# Patient Record
Sex: Female | Born: 1965
Health system: Southern US, Community
[De-identification: ages and names within clinical notes are randomized; demographics above are authoritative.]

## PROBLEM LIST (undated history)

## (undated) DIAGNOSIS — K635 Polyp of colon: Secondary | ICD-10-CM

## (undated) DIAGNOSIS — E7800 Pure hypercholesterolemia, unspecified: Secondary | ICD-10-CM

## (undated) DIAGNOSIS — F419 Anxiety disorder, unspecified: Secondary | ICD-10-CM

## (undated) HISTORY — DX: Polyp of colon: K63.5

## (undated) HISTORY — PX: LAPAROSCOPY: SHX197

---

## 2016-03-18 DIAGNOSIS — R0789 Other chest pain: Secondary | ICD-10-CM | POA: Diagnosis not present

## 2016-03-18 DIAGNOSIS — R079 Chest pain, unspecified: Secondary | ICD-10-CM | POA: Diagnosis not present

## 2016-09-09 DIAGNOSIS — E782 Mixed hyperlipidemia: Secondary | ICD-10-CM | POA: Diagnosis not present

## 2017-03-10 DIAGNOSIS — R198 Other specified symptoms and signs involving the digestive system and abdomen: Secondary | ICD-10-CM | POA: Diagnosis not present

## 2017-03-10 DIAGNOSIS — M25521 Pain in right elbow: Secondary | ICD-10-CM | POA: Diagnosis not present

## 2017-03-10 DIAGNOSIS — E782 Mixed hyperlipidemia: Secondary | ICD-10-CM | POA: Diagnosis not present

## 2017-04-28 ENCOUNTER — Encounter (HOSPITAL_COMMUNITY): Payer: Self-pay | Admitting: Nurse Practitioner

## 2017-04-28 ENCOUNTER — Emergency Department (HOSPITAL_COMMUNITY)
Admission: EM | Admit: 2017-04-28 | Discharge: 2017-04-28 | Disposition: A | Discharge status: Home / Self Care | Payer: BLUE CROSS/BLUE SHIELD | Attending: Emergency Medicine | Admitting: Emergency Medicine

## 2017-04-28 ENCOUNTER — Emergency Department (HOSPITAL_COMMUNITY): Payer: BLUE CROSS/BLUE SHIELD

## 2017-04-28 DIAGNOSIS — R0789 Other chest pain: Secondary | ICD-10-CM | POA: Insufficient documentation

## 2017-04-28 DIAGNOSIS — F17200 Nicotine dependence, unspecified, uncomplicated: Secondary | ICD-10-CM | POA: Insufficient documentation

## 2017-04-28 DIAGNOSIS — R079 Chest pain, unspecified: Secondary | ICD-10-CM | POA: Diagnosis not present

## 2017-04-28 HISTORY — DX: Anxiety disorder, unspecified: F41.9

## 2017-04-28 HISTORY — DX: Pure hypercholesterolemia, unspecified: E78.00

## 2017-04-28 LAB — CBC
HCT: 45.9 % (ref 36.0–46.0)
Hemoglobin: 15.1 g/dL — ABNORMAL HIGH (ref 12.0–15.0)
MCH: 30.2 pg (ref 26.0–34.0)
MCHC: 32.9 g/dL (ref 30.0–36.0)
MCV: 91.8 fL (ref 78.0–100.0)
PLATELETS: 256 10*3/uL (ref 150–400)
RBC: 5 MIL/uL (ref 3.87–5.11)
RDW: 14 % (ref 11.5–15.5)
WBC: 13.6 10*3/uL — AB (ref 4.0–10.5)

## 2017-04-28 LAB — I-STAT TROPONIN, ED
Troponin i, poc: 0 ng/mL (ref 0.00–0.08)
Troponin i, poc: 0 ng/mL (ref 0.00–0.08)

## 2017-04-28 LAB — D-DIMER, QUANTITATIVE (NOT AT ARMC)

## 2017-04-28 LAB — BASIC METABOLIC PANEL
Anion gap: 8 (ref 5–15)
BUN: 16 mg/dL (ref 6–20)
CALCIUM: 9.6 mg/dL (ref 8.9–10.3)
CO2: 25 mmol/L (ref 22–32)
CREATININE: 0.84 mg/dL (ref 0.44–1.00)
Chloride: 104 mmol/L (ref 101–111)
GFR calc Af Amer: >60 mL/min (ref >60)
GLUCOSE: 92 mg/dL (ref 65–99)
Potassium: 3.9 mmol/L (ref 3.5–5.1)
SODIUM: 137 mmol/L (ref 135–145)

## 2017-04-28 NOTE — ED Triage Notes (Signed)
Pt endorses left sided chest pain started one hour ago while she was at work. Endorses lightheadedness associated with it but denies shortness of breath, nausea, weakness, numbness or radiation. Patient states pain has been constant but will get acutely worse every once in awhile. Patient states called EMS who stated patient should come in to be further evaluated despite negative EKG.

## 2017-04-28 NOTE — Discharge Instructions (Signed)
Please follow up with family doctor if continue to have pain. Return if worsening symptoms.

## 2017-04-28 NOTE — ED Provider Notes (Signed)
[facility]EM[facility]ENC[facility] DEPT Provider Note   CSN: [id number] Arrival date & time: 04/28/17  1539     History   Chief Complaint Chief Complaint  Patient presents with  . Chest Pain    HPI Judith [name] is a 51 y.o. female.  HPI Judith [name] is a 51 y.o. female with history of anxiety, high cholesterol, presents to emergency department complaining of chest pain. Patient states she was at work sitting down when suddenly developed sharp pain in left side of the chest. She states pain now subsided. She reports similar pain in the past but not as severe. She had a full cardiac workup including a stress test, echo, blood tests, one year ago in [place]. She has now moved to [place] and does have a family doctor who she has seen for similar pain as well just 2 months ago. Advised her to take antacids and will schedule her for endoscopy if pain continues. She states today the pain was in slightly different location. She states it was higher. She denies anything making pain worse including movement or deep breathing. She states nothing makes it better. She does smoke. Denies hypertension or diabetes. She does have positive family history for heart disease and states her mother died from heart attack at 52. Her father passed away at 40 from heart disease. Patient denies any history of blood clots. She denies recent surgery or travel. No hormone therapy.  Past Medical History:  Diagnosis Date  . Anxiety   . Hypercholesteremia     There are no active problems to display for this patient.   History reviewed. No pertinent surgical history.  OB History    No data available       Home Medications    Prior to Admission medications   Not on File    Family History No family history on file.  Social History Social History  Substance Use Topics  . Smoking status: Current Every Day Smoker    Packs/day: 1.00  . Smokeless tobacco: Never Used  . Alcohol use Yes     Comment: occasionally       Allergies   Patient has no known allergies.   Review of Systems Review of Systems  Constitutional: Negative for chills and fever.  Respiratory: Positive for chest tightness. Negative for cough and shortness of breath.   Cardiovascular: Positive for chest pain. Negative for palpitations and leg swelling.  Gastrointestinal: Negative for abdominal pain, diarrhea, nausea and vomiting.  Genitourinary: Negative for dysuria, flank pain, pelvic pain, vaginal bleeding, vaginal discharge and vaginal pain.  Musculoskeletal: Negative for arthralgias, myalgias, neck pain and neck stiffness.  Skin: Negative for rash.  Neurological: Negative for dizziness, weakness and headaches.  All other systems reviewed and are negative.    Physical Exam Updated Vital Signs BP (!) 103/91 (BP Location: Left Arm)   Pulse 72   Temp 97.8 F (36.6 C) (Oral)   Resp (!) 8   Ht 5\' 9"  (1.753 m)   Wt 62.6 kg (138 lb)   LMP 04/14/2017   SpO2 99%   BMI 20.38 kg/m   Physical Exam  Constitutional: She appears well-developed and well-nourished. No distress.  HENT:  Head: Normocephalic.  Eyes: Conjunctivae are normal.  Neck: Neck supple.  Cardiovascular: Normal rate, regular rhythm and normal heart sounds.   Pulmonary/Chest: Effort normal and breath sounds normal. No respiratory distress. She has no wheezes. She has no rales. She exhibits no tenderness.  Abdominal: Soft. Bowel sounds are normal. She exhibits  no distension. There is no tenderness. There is no rebound.  Musculoskeletal: She exhibits no edema.  Neurological: She is alert.  Skin: Skin is warm and dry.  Psychiatric: She has a normal mood and affect. Her behavior is normal.  Nursing note and vitals reviewed.    ED Treatments / Results  Labs (all labs ordered are listed, but only abnormal results are displayed) Labs Reviewed  CBC - Abnormal; Notable for the following:       Result Value   WBC 13.6 (*)    Hemoglobin 15.1 (*)    All  other components within normal limits  BASIC METABOLIC PANEL  D-DIMER, QUANTITATIVE (NOT [facility])  I-STAT TROPOININ, ED  Rosezena SensorI-STAT TROPOININ, ED    EKG  EKG Interpretation None       Radiology Dg Chest 2 View  Result Date: 04/28/2017 CLINICAL DATA:  Left-sided chest pain starting this morning. EXAM: CHEST  2 VIEW COMPARISON:  No comparison studies available. FINDINGS: The lungs are clear without focal pneumonia, edema, pneumothorax or pleural effusion. The cardiopericardial silhouette is within normal limits for size. The visualized bony structures of the thorax are intact. Nodular density/densities projecting over the lungs are compatible with pads for telemetry leads. IMPRESSION: No active cardiopulmonary disease. Electronically Signed   By: Kennith CenterEric  Mansell M.D.   On: 04/28/2017 16:26    Procedures Procedures (including critical care time)  Medications Ordered in ED Medications - No data to display   Initial Impression / Assessment and Plan / ED Course  I have reviewed the triage vital signs and the nursing notes.  Pertinent labs & imaging results that were available during my care of the patient were reviewed by me and considered in my medical decision making (see chart for details).     Patient with left upper chest pain, sudden onset while at work a few hours ago. Blood work obtained in triage, negative troponin, white blood cell count slightly elevated at 13.6, normal electrolytes, normal chest x-ray. We will do delta troponin and check d-dimer.   Delta trop and d dimer negative. Pain atypical. Do not think acs, do not think dissection or PE. Will have her follow up with PCP. Return precautions discussed.   Vitals:   04/28/17 1914 04/28/17 1915 04/28/17 1945 04/28/17 2030  BP: (!) 103/91 130/80 114/76 108/76  Pulse: 72 71 66 61  Resp: (!) 8 14 19 17   Temp:      TempSrc:      SpO2: 99% 99% 98% 98%  Weight:      Height:        Final Clinical Impressions(s) / ED  Diagnoses   Final diagnoses:  Atypical chest pain    New Prescriptions There are no discharge medications for this patient.    Jaynie CrumbleKirichenko, Eleen Litz, PA-C 04/29/17 Karle [id number]    Zammit, Joseph, MD 04/29/17 [phone]

## 2017-04-29 DIAGNOSIS — R079 Chest pain, unspecified: Secondary | ICD-10-CM | POA: Diagnosis not present

## 2017-05-05 DIAGNOSIS — R0789 Other chest pain: Secondary | ICD-10-CM | POA: Diagnosis not present

## 2017-05-05 DIAGNOSIS — E782 Mixed hyperlipidemia: Secondary | ICD-10-CM | POA: Diagnosis not present

## 2017-05-05 DIAGNOSIS — K219 Gastro-esophageal reflux disease without esophagitis: Secondary | ICD-10-CM | POA: Diagnosis not present

## 2017-05-06 ENCOUNTER — Telehealth: Payer: Self-pay

## 2017-05-06 NOTE — Telephone Encounter (Signed)
SENT TO SCHEDULING 

## 2017-05-11 DIAGNOSIS — L301 Dyshidrosis [pompholyx]: Secondary | ICD-10-CM | POA: Diagnosis not present

## 2017-05-12 ENCOUNTER — Telehealth: Payer: Self-pay | Admitting: Internal Medicine

## 2017-05-12 NOTE — Telephone Encounter (Signed)
Received records from [facility] for appointment on 06/13/17 with Dr Rennis GoldenHilty.  Records put with Dr Blanchie DessertHilty's schedule for 06/13/17. lp

## 2017-05-30 DIAGNOSIS — R0789 Other chest pain: Secondary | ICD-10-CM

## 2017-05-30 DIAGNOSIS — K219 Gastro-esophageal reflux disease without esophagitis: Secondary | ICD-10-CM

## 2017-05-30 DIAGNOSIS — E782 Mixed hyperlipidemia: Secondary | ICD-10-CM | POA: Insufficient documentation

## 2017-06-13 ENCOUNTER — Encounter (INDEPENDENT_AMBULATORY_CARE_PROVIDER_SITE_OTHER): Payer: Self-pay

## 2017-06-13 ENCOUNTER — Ambulatory Visit (INDEPENDENT_AMBULATORY_CARE_PROVIDER_SITE_OTHER): Payer: BLUE CROSS/BLUE SHIELD | Admitting: Internal Medicine

## 2017-06-13 ENCOUNTER — Encounter: Payer: Self-pay | Admitting: Internal Medicine

## 2017-06-13 VITALS — BP 104/74 | HR 75 | Ht 68.0 in | Wt 140.0 lb

## 2017-06-13 DIAGNOSIS — E782 Mixed hyperlipidemia: Secondary | ICD-10-CM

## 2017-06-13 DIAGNOSIS — R0789 Other chest pain: Secondary | ICD-10-CM | POA: Diagnosis not present

## 2017-06-13 DIAGNOSIS — Z720 Tobacco use: Secondary | ICD-10-CM | POA: Diagnosis not present

## 2017-06-13 NOTE — Patient Instructions (Signed)
Medication Instructions:  Continue current medications  Labwork: None Ordered  Testing/Procedures: None Ordered  Follow-Up: Your physician recommends that you schedule a follow-up appointment in: As Needed   Any Other Special Instructions Will Be Listed Below (If Applicable).   If you need a refill on your cardiac medications before your next appointment, please call your pharmacy.   

## 2017-06-13 NOTE — Progress Notes (Signed)
OFFICE CONSULT NOTE  Chief Complaint:  Chest pain  Primary Care Physician: System, Pcp Not In  HPI:  Judith [name] is a 51 y.o. female who is being seen today for the evaluation of chest pain at the request of Le, Thao P, DO. She is a pleasant 51 year old female who is from [place]. Recently she was living in [place] and moved to [place]. She works for [employer]. Her husband works for them as well. Last year in the summer she had an episode of chest pain for which she underwent cardiac evaluation at [facility] in [facility]. She apparently had a stress test including a stress echocardiogram which was normal. There is a strong family history of heart disease in both parents. Her mother died at age 63 with heart attack and has a history of epilepsy and her father died at age 53 of a heart attack. She also has dyslipidemia which is well treated and her most recent LDL-C is 80. EKG which is personally reviewed today shows sinus rhythm with first-degree AV block at 75. She had a recent episode of chest pain which was sharp in the left side of chest which occurred at work. She ultimately presented the ER but was not noted to have any acute MI. She said the chest pain came on at rest, was not radiating and was somewhat worse after eating. Her PCP is concerned this may be reflux and put her on Dexilant.  PMHx:  Past Medical History:  Diagnosis Date  . Anxiety   . Hypercholesteremia     History reviewed. No pertinent surgical history.  FAMHx:  Family History  Problem Relation Age of Onset  . Heart attack Mother   . Heart attack Father   . Hyperlipidemia Brother   . Stroke Brother     SOCHx:   reports that she has been smoking.  She has been smoking about 1.00 pack per day. She has never used smokeless tobacco. She reports that she drinks alcohol. Her drug history is not on file.  ALLERGIES:  No Known Allergies  ROS: Pertinent items noted in HPI and  remainder of comprehensive ROS otherwise negative.  HOME MEDS: Current Outpatient Prescriptions on File Prior to Visit  Medication Sig Dispense Refill  . aspirin EC 81 MG tablet Take 81 mg by mouth daily.    Marland Kitchen atorvastatin (LIPITOR) 10 MG tablet Take 10 mg by mouth daily.  3  . Omega-3 Fatty Acids (FISH OIL OMEGA-3) 1000 MG CAPS Take 2 capsules by mouth daily.    . ranitidine (ZANTAC) 150 MG tablet Take 150 mg by mouth at bedtime.     No current facility-administered medications on file prior to visit.     LABS/IMAGING: No results found for this or any previous visit (from the past 48 hour(s)). No results found.  LIPID PANEL: No results found for: CHOL, TRIG, HDL, CHOLHDL, VLDL, LDLCALC, LDLDIRECT  WEIGHTS: Wt Readings from Last 3 Encounters:  06/13/17 140 lb (63.5 kg)  04/28/17 138 lb (62.6 kg)    VITALS: BP 104/74   Pulse 75   Ht 5\' 8"  (1.727 m)   Wt 140 lb (63.5 kg)   BMI 21.29 kg/m   EXAM: General appearance: alert and no distress Neck: no carotid bruit, no JVD and thyroid not enlarged, symmetric, no tenderness/mass/nodules Lungs: clear to auscultation bilaterally Heart: regular rate and rhythm Abdomen: soft, non-tender; bowel sounds normal; no masses,  no organomegaly Extremities: extremities normal, atraumatic, no  cyanosis or edema Pulses: 2+ and symmetric Skin: Skin color, texture, turgor normal. No rashes or lesions Neurologic: Grossly normal Psych: Pleasant  EKG: Sinus rhythm with first-degree AV block at 75, possible septal infarct - personally reviewed  ASSESSMENT: 1. Atypical chest pain 2. Dyslipidemia-manage 3. Tobacco abuse 4. Family history of premature coronary disease in both parents  PLAN: 1.   Judith [name] has family history of heart disease in both parents and has been having chest pain which sounds atypical for cardiac chest pain. It may very well be reflux and a GI workup is reasonable. She is a smoker and some of her symptoms could be  attributable to that. I've encouraged her to at least work on reducing her smoking. Cholesterol appears well managed. She has normal blood pressure off medication and is nondiabetic. I'll request records from her cardiology workup in [place] a year ago and review those, otherwise I would recommend follow-up as needed.  Thanks for the consultation.  Chrystie Nose, MD, [facility]  [place]  [facility]  Attending Cardiologist  Direct Dial: [phone]  Fax: [phone]  Website:  [email or web address] 06/13/2017, 4:57 PM

## 2017-08-31 DIAGNOSIS — Z124 Encounter for screening for malignant neoplasm of cervix: Secondary | ICD-10-CM | POA: Diagnosis not present

## 2017-08-31 DIAGNOSIS — Z0000 Encounter for general adult medical examination without abnormal findings: Secondary | ICD-10-CM | POA: Diagnosis not present

## 2017-08-31 DIAGNOSIS — E782 Mixed hyperlipidemia: Secondary | ICD-10-CM | POA: Diagnosis not present

## 2018-03-02 DIAGNOSIS — G4720 Circadian rhythm sleep disorder, unspecified type: Secondary | ICD-10-CM | POA: Diagnosis not present

## 2018-03-02 DIAGNOSIS — D729 Disorder of white blood cells, unspecified: Secondary | ICD-10-CM | POA: Diagnosis not present

## 2018-03-02 DIAGNOSIS — E782 Mixed hyperlipidemia: Secondary | ICD-10-CM | POA: Diagnosis not present

## 2018-03-02 DIAGNOSIS — R5383 Other fatigue: Secondary | ICD-10-CM | POA: Diagnosis not present

## 2018-06-13 DIAGNOSIS — M7712 Lateral epicondylitis, left elbow: Secondary | ICD-10-CM | POA: Diagnosis not present

## 2018-08-08 DIAGNOSIS — R1013 Epigastric pain: Secondary | ICD-10-CM | POA: Diagnosis not present

## 2018-08-08 DIAGNOSIS — N926 Irregular menstruation, unspecified: Secondary | ICD-10-CM | POA: Diagnosis not present

## 2018-08-08 DIAGNOSIS — D729 Disorder of white blood cells, unspecified: Secondary | ICD-10-CM | POA: Diagnosis not present

## 2018-08-08 DIAGNOSIS — R079 Chest pain, unspecified: Secondary | ICD-10-CM | POA: Diagnosis not present

## 2018-08-08 DIAGNOSIS — K219 Gastro-esophageal reflux disease without esophagitis: Secondary | ICD-10-CM | POA: Diagnosis not present

## 2018-08-08 DIAGNOSIS — E782 Mixed hyperlipidemia: Secondary | ICD-10-CM | POA: Diagnosis not present

## 2018-08-08 DIAGNOSIS — R0789 Other chest pain: Secondary | ICD-10-CM | POA: Diagnosis not present

## 2018-08-08 DIAGNOSIS — Z3202 Encounter for pregnancy test, result negative: Secondary | ICD-10-CM | POA: Diagnosis not present

## 2018-08-16 DIAGNOSIS — R1013 Epigastric pain: Secondary | ICD-10-CM | POA: Diagnosis not present

## 2018-08-16 DIAGNOSIS — D729 Disorder of white blood cells, unspecified: Secondary | ICD-10-CM | POA: Diagnosis not present

## 2018-08-28 DIAGNOSIS — Z1211 Encounter for screening for malignant neoplasm of colon: Secondary | ICD-10-CM | POA: Diagnosis not present

## 2018-08-28 DIAGNOSIS — K219 Gastro-esophageal reflux disease without esophagitis: Secondary | ICD-10-CM | POA: Diagnosis not present

## 2018-08-28 DIAGNOSIS — R079 Chest pain, unspecified: Secondary | ICD-10-CM | POA: Diagnosis not present

## 2018-09-25 DIAGNOSIS — D123 Benign neoplasm of transverse colon: Secondary | ICD-10-CM | POA: Diagnosis not present

## 2018-09-25 DIAGNOSIS — K219 Gastro-esophageal reflux disease without esophagitis: Secondary | ICD-10-CM | POA: Diagnosis not present

## 2018-09-25 DIAGNOSIS — D122 Benign neoplasm of ascending colon: Secondary | ICD-10-CM | POA: Diagnosis not present

## 2018-09-25 DIAGNOSIS — R079 Chest pain, unspecified: Secondary | ICD-10-CM | POA: Diagnosis not present

## 2018-09-25 DIAGNOSIS — Z1211 Encounter for screening for malignant neoplasm of colon: Secondary | ICD-10-CM | POA: Diagnosis not present

## 2018-09-25 DIAGNOSIS — D125 Benign neoplasm of sigmoid colon: Secondary | ICD-10-CM | POA: Diagnosis not present

## 2018-09-25 DIAGNOSIS — D120 Benign neoplasm of cecum: Secondary | ICD-10-CM | POA: Diagnosis not present

## 2018-09-25 LAB — HM COLONOSCOPY

## 2019-01-04 DIAGNOSIS — R350 Frequency of micturition: Secondary | ICD-10-CM | POA: Diagnosis not present

## 2019-01-04 DIAGNOSIS — N938 Other specified abnormal uterine and vaginal bleeding: Secondary | ICD-10-CM | POA: Diagnosis not present

## 2019-01-04 DIAGNOSIS — R102 Pelvic and perineal pain: Secondary | ICD-10-CM | POA: Diagnosis not present

## 2019-01-15 DIAGNOSIS — N898 Other specified noninflammatory disorders of vagina: Secondary | ICD-10-CM | POA: Diagnosis not present

## 2019-01-15 DIAGNOSIS — R102 Pelvic and perineal pain: Secondary | ICD-10-CM | POA: Diagnosis not present

## 2019-01-22 DIAGNOSIS — R102 Pelvic and perineal pain: Secondary | ICD-10-CM | POA: Diagnosis not present

## 2019-09-05 DIAGNOSIS — K219 Gastro-esophageal reflux disease without esophagitis: Secondary | ICD-10-CM | POA: Diagnosis not present

## 2019-09-05 DIAGNOSIS — Z86010 Personal history of colonic polyps: Secondary | ICD-10-CM | POA: Diagnosis not present

## 2019-10-04 ENCOUNTER — Encounter: Payer: Self-pay | Admitting: Osteopathic Medicine

## 2019-10-04 ENCOUNTER — Ambulatory Visit (INDEPENDENT_AMBULATORY_CARE_PROVIDER_SITE_OTHER): Payer: BC Managed Care – PPO | Admitting: Osteopathic Medicine

## 2019-10-04 VITALS — Ht 68.0 in | Wt 138.0 lb

## 2019-10-04 DIAGNOSIS — Z720 Tobacco use: Secondary | ICD-10-CM

## 2019-10-04 DIAGNOSIS — Z0000 Encounter for general adult medical examination without abnormal findings: Secondary | ICD-10-CM

## 2019-10-04 DIAGNOSIS — E782 Mixed hyperlipidemia: Secondary | ICD-10-CM

## 2019-10-04 DIAGNOSIS — K219 Gastro-esophageal reflux disease without esophagitis: Secondary | ICD-10-CM

## 2019-10-04 DIAGNOSIS — F17210 Nicotine dependence, cigarettes, uncomplicated: Secondary | ICD-10-CM

## 2019-10-04 DIAGNOSIS — S99922S Unspecified injury of left foot, sequela: Secondary | ICD-10-CM

## 2019-10-04 NOTE — Patient Instructions (Addendum)
General Preventive Care  Most recent routine screening lipids/other labs: ordered - please get this blood work done at lab on lower level of [employer]  Everyone should have blood pressure checked once per year.   Tobacco: don't! Please let me know if you need help quitting!  Alcohol: responsible moderation is ok for most adults - if you have concerns about your alcohol intake, please talk to me!   Exercise: as tolerated to reduce risk of cardiovascular disease and diabetes. Strength training will also prevent osteoporosis.   Mental health: if need for mental health care (medicines, counseling, other), or concerns about moods, please let me know!   Sexual health: if need for STD testing, or if concerns with libido/pain problems, please let me know!  Advanced Directive: Living Will and/or Healthcare Power of Attorney recommended for all adults, regardless of age or health.  Vaccines  Flu vaccine: recommended for almost everyone, every fall.   Shingles vaccine: Shingrix recommended after age 4.  Pneumonia vaccines: Prevnar and Pneumovax recommended after age 76, or sooner if certain medical conditions.  Tetanus booster: Tdap recommended every 10 years.   COVID vaccine: Currently there is not a firm plan for when this will be available to the public, or how/where it will be administered once it is available. Our clinic will probably be sending letters/MyChart messages to patients once we have more information/instructions.  Cancer screenings   Colon cancer screening: as directed by GI - will get records  Breast cancer screening: mammogram annually after age 3 - will get records  Cervical cancer screening: Pap every year for smokers!  Lung cancer screening: CT chest every year for those age 25 to 54 years old with ?30 pack year smoking history (average 1 pack per day for 30 years, 2 packs per day for 15 years, etc), who either currently smoke or have quit within the past  15 years. Infection screenings . HIV: recommended screening at least once age 43-65, more often as needed. . Gonorrhea/Chlamydia: screening as needed . Hepatitis C: recommended once for anyone born 90-1965 . TB: certain at-risk populations, or depending on work requirements and/or travel history Other . Bone Density Test: recommended for women at age 66, or usually younger for smokers

## 2019-10-04 NOTE — Progress Notes (Signed)
Virtual Visit via Video (App used: Doximity) Note  I connected with      Judith [name] on 10/04/19 at 2:25 PM  by a telemedicine application and verified that I am speaking with the correct person using two identifiers.  Patient is in her car I am in office   I discussed the limitations of evaluation and management by telemedicine and the availability of in person appointments. The patient expressed understanding and agreed to proceed.  History of Present Illness: Judith [name] is a 53 y.o. female who would like to discuss Annual Physical, new to establish care   No major concerns She is in her car on her way to [place], her brother is sick w/ heart failure Needs physical for insurance if possible    ROS negative    Observations/Objective: Ht 5\' 8"  (1.727 m)   Wt 138 lb (62.6 kg)   BMI 20.98 kg/m  BP Readings from Last 3 Encounters:  06/13/17 104/74  04/28/17 108/76   Exam: Normal Speech.  NAD  Lab and Radiology Results No results found for this or any previous visit (from the past 72 hour(s)). No results found.     Assessment and Plan: 53 y.o. female with The primary encounter diagnosis was Annual physical exam. Diagnoses of Mixed hyperlipidemia, Gastroesophageal reflux disease without esophagitis, and Tobacco abuse were also pertinent to this visit.   PDMP not reviewed this encounter. Orders Placed This Encounter  Procedures  . CBC  . COMPLETE METABOLIC PANEL WITH GFR  . LIPID SCREENING   No orders of the defined types were placed in this encounter.  Patient Instructions  General Preventive Care  Most recent routine screening lipids/other labs: ordered - please get this blood work done at lab on lower level of [address]  Everyone should have blood pressure checked once per year.   Tobacco: don't! Please let me know if you need help quitting!  Alcohol: responsible moderation is ok for most adults - if you have concerns about your alcohol  intake, please talk to me!   Exercise: as tolerated to reduce risk of cardiovascular disease and diabetes. Strength training will also prevent osteoporosis.   Mental health: if need for mental health care (medicines, counseling, other), or concerns about moods, please let me know!   Sexual health: if need for STD testing, or if concerns with libido/pain problems, please let me know!  Advanced Directive: Living Will and/or Healthcare Power of Attorney recommended for all adults, regardless of age or health.  Vaccines  Flu vaccine: recommended for almost everyone, every fall.   Shingles vaccine: Shingrix recommended after age 95.  Pneumonia vaccines: Prevnar and Pneumovax recommended after age 17, or sooner if certain medical conditions.  Tetanus booster: Tdap recommended every 10 years.   COVID vaccine: Currently there is not a firm plan for when this will be available to the public, or how/where it will be administered once it is available. Our clinic will probably be sending letters/MyChart messages to patients once we have more information/instructions.  Cancer screenings   Colon cancer screening: as directed by GI - will get records  Breast cancer screening: mammogram annually after age 29 - will get records  Cervical cancer screening: Pap every year for smokers!  Lung cancer screening: CT chest every year for those age 58 to 54 years old with ?30 pack year smoking history (average 1 pack per day for 30 years, 2 packs per day for 15 years, etc), who either currently smoke  or have quit within the past 15 years. Infection screenings . HIV: recommended screening at least once age 28-65, more often as needed. . Gonorrhea/Chlamydia: screening as needed . Hepatitis C: recommended once for anyone born [birth year]-1965 . TB: certain at-risk populations, or depending on work requirements and/or travel history Other . Bone Density Test: recommended for women at age 17, or usually younger for  smokers   Instructions sent via [place]. If MyChart not available, pt was given option for info via personal e-mail w/ no guarantee of protected health info over unsecured e-mail communication, and MyChart sign-up instructions were sent to patient.   Follow Up Instructions: Return for RECHECK PENDING LAB RESULTS - if ok, will see you in a year! .    I discussed the assessment and treatment plan with the patient. The patient was provided an opportunity to ask questions and all were answered. The patient agreed with the plan and demonstrated an understanding of the instructions.   The patient was advised to call back or seek an in-person evaluation if any new concerns, if symptoms worsen or if the condition fails to improve as anticipated.  30 minutes of non-face-to-face time was provided during this encounter.      . . . . . . . . . . . . . Marland Kitchen                   Historical information moved to improve visibility of documentation.  Past Medical History:  Diagnosis Date  . Anxiety   . Colon polyps   . Hypercholesteremia    History reviewed. No pertinent surgical history. Social History   Tobacco Use  . Smoking status: Current Every Day Smoker    Packs/day: 1.50  . Smokeless tobacco: Never Used  Substance Use Topics  . Alcohol use: Yes    Comment: three times a year   family history includes Alcoholism in her father and mother; Heart attack in her father and mother; Hyperlipidemia in her brother; Stroke in her brother.  Medications: Current Outpatient Medications  Medication Sig Dispense Refill  . omeprazole (PRILOSEC) 20 MG capsule Take by mouth.    Marland Kitchen aspirin EC 81 MG tablet Take 81 mg by mouth daily.    Marland Kitchen atorvastatin (LIPITOR) 10 MG tablet Take 10 mg by mouth daily.  3  . Omega-3 Fatty Acids (FISH OIL OMEGA-3) 1000 MG CAPS Take 2 capsules by mouth daily.    . ranitidine (ZANTAC) 150 MG tablet Take 150 mg by mouth at bedtime.     No current  facility-administered medications for this visit.   No Known Allergies

## 2019-10-04 NOTE — Addendum Note (Signed)
Addended by: Maryla Morrow on: 10/04/2019 03:03 PM   Modules accepted: Level of Service

## 2019-10-04 NOTE — Addendum Note (Signed)
Addended by: Maryla Morrow on: 10/04/2019 03:14 PM   Modules accepted: Orders

## 2019-10-25 ENCOUNTER — Encounter: Payer: Self-pay | Admitting: Osteopathic Medicine

## 2019-10-25 LAB — CBC
HCT: 48.3 % — ABNORMAL HIGH (ref 35.0–45.0)
Hemoglobin: 16.3 g/dL — ABNORMAL HIGH (ref 11.7–15.5)
MCH: 30.2 pg (ref 27.0–33.0)
MCHC: 33.7 g/dL (ref 32.0–36.0)
MCV: 89.6 fL (ref 80.0–100.0)
MPV: 9.5 fL (ref 7.5–12.5)
Platelets: 293 10*3/uL (ref 140–400)
RBC: 5.39 10*6/uL — ABNORMAL HIGH (ref 3.80–5.10)
RDW: 12.6 % (ref 11.0–15.0)
WBC: 13.1 10*3/uL — ABNORMAL HIGH (ref 3.8–10.8)

## 2019-10-25 LAB — COMPLETE METABOLIC PANEL WITH GFR
AG Ratio: 1.6 (calc) (ref 1.0–2.5)
ALT: 13 U/L (ref 6–29)
AST: 14 U/L (ref 10–35)
Albumin: 4.7 g/dL (ref 3.6–5.1)
Alkaline phosphatase (APISO): 99 U/L (ref 37–153)
BUN: 14 mg/dL (ref 7–25)
CO2: 27 mmol/L (ref 20–32)
Calcium: 10.1 mg/dL (ref 8.6–10.4)
Chloride: 100 mmol/L (ref 98–110)
Creat: 0.68 mg/dL (ref 0.50–1.05)
GFR, Est African American: 115 mL/min/{1.73_m2} (ref >=60)
GFR, Est Non African American: 99 mL/min/{1.73_m2} (ref >=60)
Globulin: 2.9 g/dL (calc) (ref 1.9–3.7)
Glucose, Bld: 87 mg/dL (ref 65–139)
Potassium: 4.9 mmol/L (ref 3.5–5.3)
Sodium: 137 mmol/L (ref 135–146)
Total Bilirubin: 0.4 mg/dL (ref 0.2–1.2)
Total Protein: 7.6 g/dL (ref 6.1–8.1)

## 2019-10-25 LAB — LIPID PANEL
Cholesterol: 225 mg/dL — ABNORMAL HIGH (ref <200)
HDL: 48 mg/dL — ABNORMAL LOW (ref >=50)
LDL Cholesterol (Calc): 152 mg/dL (calc) — ABNORMAL HIGH
Non-HDL Cholesterol (Calc): 177 mg/dL (calc) — ABNORMAL HIGH (ref <130)
Total CHOL/HDL Ratio: 4.7 (calc) (ref <5.0)
Triglycerides: 124 mg/dL (ref <150)

## 2019-10-30 ENCOUNTER — Telehealth: Payer: Self-pay

## 2019-10-30 ENCOUNTER — Telehealth: Payer: Self-pay | Admitting: Physician Assistant

## 2019-10-30 DIAGNOSIS — D582 Other hemoglobinopathies: Secondary | ICD-10-CM

## 2019-10-30 DIAGNOSIS — D72829 Elevated white blood cell count, unspecified: Secondary | ICD-10-CM

## 2019-10-30 NOTE — Telephone Encounter (Signed)
Pt states that she spoke to her [employer] [facility] company and they said that if they could get a letter of medical necessity from their doctor that it will cover the cost of home gym equipment. Pt is wanting to know if they can get this LMN for her cholesterol and her husband, Trey Paula Fausto, for weight loss.

## 2019-10-30 NOTE — Progress Notes (Signed)
This is PA Carrine Kroboth covering for Dr. Lyn Hollingshead  Your white blood cell count is abnormally high, but unchanged compared to 2018. This could be a condition called benign leukocytosis. I would recommend repeating this test in the next week or two with something called a peripheral smear, where a doctor looks at your blood cells under a microscope to rule out presence of abnormal cells.  Your hemoglobin level has also increased a bit compared to 2018. Sometimes this can be seen with smoking/tobacco products. Do you smoke?  Total and LDL cholesterol are elevated. Recommend low-carb, heart healthy diet like DASH or Mediterranean, try to walk at least 20 minutes daily You can read more about managing your cholesterol here: [email or web address]

## 2019-11-01 ENCOUNTER — Encounter: Payer: Self-pay | Admitting: Physician Assistant

## 2019-11-01 NOTE — Telephone Encounter (Signed)
Letter available on patient's MyChart

## 2019-11-01 NOTE — Telephone Encounter (Signed)
Pt aware letter has been written and is available for viewing and downloading via MyChart.

## 2019-11-01 NOTE — Telephone Encounter (Signed)
CBC W/DIFF and SMEAR for Leukocytosis and Polycythemia

## 2019-11-06 LAB — CBC (INCLUDES DIFF/PLT) WITH PATHOLOGIST REVIEW
Absolute Monocytes: 924 cells/uL (ref 200–950)
Basophils Absolute: 72 cells/uL (ref 0–200)
Basophils Relative: 0.6 %
Eosinophils Absolute: 216 cells/uL (ref 15–500)
Eosinophils Relative: 1.8 %
HCT: 49 % — ABNORMAL HIGH (ref 35.0–45.0)
Hemoglobin: 16.5 g/dL — ABNORMAL HIGH (ref 11.7–15.5)
Lymphs Abs: 4212 cells/uL — ABNORMAL HIGH (ref 850–3900)
MCH: 30.2 pg (ref 27.0–33.0)
MCHC: 33.7 g/dL (ref 32.0–36.0)
MCV: 89.7 fL (ref 80.0–100.0)
MPV: 9.8 fL (ref 7.5–12.5)
Monocytes Relative: 7.7 %
Neutro Abs: 6576 cells/uL (ref 1500–7800)
Neutrophils Relative %: 54.8 %
Platelets: 286 10*3/uL (ref 140–400)
RBC: 5.46 10*6/uL — ABNORMAL HIGH (ref 3.80–5.10)
RDW: 12.6 % (ref 11.0–15.0)
Total Lymphocyte: 35.1 %
WBC: 12 10*3/uL — ABNORMAL HIGH (ref 3.8–10.8)

## 2019-11-09 ENCOUNTER — Encounter: Payer: Self-pay | Admitting: Osteopathic Medicine

## 2019-11-13 DIAGNOSIS — R05 Cough: Secondary | ICD-10-CM | POA: Diagnosis not present

## 2019-11-13 DIAGNOSIS — Z86010 Personal history of colonic polyps: Secondary | ICD-10-CM | POA: Diagnosis not present

## 2019-11-13 DIAGNOSIS — K219 Gastro-esophageal reflux disease without esophagitis: Secondary | ICD-10-CM | POA: Diagnosis not present

## 2019-11-14 ENCOUNTER — Ambulatory Visit: Payer: BC Managed Care – PPO | Admitting: Podiatry

## 2019-11-15 ENCOUNTER — Encounter: Payer: Self-pay | Admitting: Podiatry

## 2019-11-15 ENCOUNTER — Other Ambulatory Visit: Payer: Self-pay

## 2019-11-15 ENCOUNTER — Ambulatory Visit (INDEPENDENT_AMBULATORY_CARE_PROVIDER_SITE_OTHER): Payer: BC Managed Care – PPO | Admitting: Podiatry

## 2019-11-15 ENCOUNTER — Ambulatory Visit: Payer: BC Managed Care – PPO

## 2019-11-15 VITALS — Temp 97.4°F

## 2019-11-15 DIAGNOSIS — S90212A Contusion of left great toe with damage to nail, initial encounter: Secondary | ICD-10-CM

## 2019-11-15 DIAGNOSIS — L600 Ingrowing nail: Secondary | ICD-10-CM | POA: Diagnosis not present

## 2019-11-15 MED ORDER — NEOMYCIN-POLYMYXIN-HC 3.5-10000-1 OT SOLN: OTIC | 0 refills | Status: DC

## 2019-11-15 NOTE — Patient Instructions (Signed)

## 2019-11-15 NOTE — Progress Notes (Signed)
   Subjective:    Patient ID: Judith [name], female    DOB: 1965/12/31, 54 y.o.   MRN: [id number]  HPI    Review of Systems  All other systems reviewed and are negative.      Objective:   Physical Exam        Assessment & Plan:

## 2019-11-16 ENCOUNTER — Ambulatory Visit (INDEPENDENT_AMBULATORY_CARE_PROVIDER_SITE_OTHER): Payer: BC Managed Care – PPO

## 2019-11-16 ENCOUNTER — Encounter: Payer: Self-pay | Admitting: Sports Medicine

## 2019-11-16 ENCOUNTER — Ambulatory Visit (INDEPENDENT_AMBULATORY_CARE_PROVIDER_SITE_OTHER): Payer: BC Managed Care – PPO | Admitting: Sports Medicine

## 2019-11-16 DIAGNOSIS — G8929 Other chronic pain: Secondary | ICD-10-CM | POA: Diagnosis not present

## 2019-11-16 DIAGNOSIS — M545 Low back pain: Secondary | ICD-10-CM | POA: Diagnosis not present

## 2019-11-16 MED ORDER — MELOXICAM 15 MG PO TABS: ORAL_TABLET | ORAL | 3 refills | Status: DC

## 2019-11-16 NOTE — Progress Notes (Signed)
    Procedures performed today:    None.  Independent interpretation of tests performed by another provider:   None.  Impression and Recommendations:    Chronic low back pain Judith [name] has had years of axial low back pain, discogenic. Nothing overtly radicular. No red flags. We discussed the anthropology of low back pain. Adding x-rays, meloxicam, home rehab exercises, return to see me in 6 weeks, MR for interventional planning if no better.    ___________________________________________ [name]. [name], M.D., ABFM., CAQSM. Primary Care and Sports Medicine [facility]  Adjunct Instructor of Family Medicine  [employer]

## 2019-11-16 NOTE — Assessment & Plan Note (Signed)
Judith [name] has had years of axial low back pain, discogenic. Nothing overtly radicular. No red flags. We discussed the anthropology of low back pain. Adding x-rays, meloxicam, home rehab exercises, return to see me in 6 weeks, MR for interventional planning if no better.

## 2019-11-20 NOTE — Progress Notes (Signed)
Subjective:   Patient ID: Judith [name], female   DOB: 54 y.o.   MRN: [id number]   HPI Patient presents stating he is got a chronically painful big toenail left that she is tried to trim tried to soak and it seems to get worse and is been going on several years with history of injury.  Patient does not smoke likes to be active   Review of Systems  All other systems reviewed and are negative.       Objective:  Physical Exam Vitals and nursing note reviewed.  Constitutional:      Appearance: She is well-developed.  Pulmonary:     Effort: Pulmonary effort is normal.  Musculoskeletal:        General: Normal range of motion.  Skin:    General: Skin is warm.  Neurological:     Mental Status: She is alert.     Neurovascular status intact muscle strength found to be adequate range of motion within normal limits.  Patient is found to have thick yellow brittle hallux nail bed that is painful when pressed left and make shoe gear difficult with patient having tried wider shoe soaks in the past and cutting techniques      Assessment:  Chronic damaged hallux nail left with pathology and pain     Plan:  H&P reviewed condition recommended nail removal and explained procedure to patient.  Explained risk and she signed consent form after review and today I infiltrated the left hallux 60 mg like Marcaine mixture sterile prep applied to the toe and using sterile instrumentation remove the hallux nail exposed matrix and applied phenol for applications 30 seconds followed by alcohol lavage sterile dressing.  Give instructions on soaks and reappoint for [place] to recheck and encouraged to call with questions and leave dressing on 24 hours but take it off earlier if throbbing with drops being written by patient at this time

## 2019-12-17 ENCOUNTER — Telehealth: Payer: Self-pay | Admitting: *Deleted

## 2019-12-17 NOTE — Telephone Encounter (Signed)
Pt states she had an ingrown toenail over 1 month ago and it is red and still oozing blood.

## 2019-12-17 NOTE — Telephone Encounter (Signed)
I spoke with pt and she states the area is still oozing the bloody drainage. I told pt that on occasion there may be a little regrowth and she would need to be seen to have it removed, to continue the soaks and drops until seen in office. Pt states understanding and I transferred to schedulers.

## 2019-12-19 ENCOUNTER — Ambulatory Visit (INDEPENDENT_AMBULATORY_CARE_PROVIDER_SITE_OTHER): Payer: BC Managed Care – PPO | Admitting: Podiatry

## 2019-12-19 ENCOUNTER — Other Ambulatory Visit: Payer: Self-pay

## 2019-12-19 DIAGNOSIS — L600 Ingrowing nail: Secondary | ICD-10-CM

## 2019-12-19 DIAGNOSIS — L03032 Cellulitis of left toe: Secondary | ICD-10-CM

## 2019-12-19 MED ORDER — DOXYCYCLINE HYCLATE 100 MG PO TABS: 100.0000 mg | ORAL_TABLET | Freq: Two times a day (BID) | ORAL | 0 refills | Status: DC

## 2019-12-19 NOTE — Patient Instructions (Signed)

## 2019-12-21 ENCOUNTER — Encounter: Payer: Self-pay | Admitting: Podiatry

## 2019-12-21 NOTE — Progress Notes (Signed)
  Subjective:  Patient ID: Judith [name], female    DOB: 1966-10-12,  MRN: [id number]  Chief Complaint  Patient presents with  . Nail Problem    pt is here for possible infected toenail of the left great toenail, pt states that it has been constantly oozing and draining. pt states that the pain is often a 4 out of 42    54 y.o. female presents with the above complaint.  Patient presents with toenail erythema around the left hallux after she had undergone a total nail avulsion by Judith [name].  Patient states that has been going for about 5 days.  There is some redness around the toenail.  Patient states been constantly draining.  Patient has been applying Band-Aid and been doing gentamicin drops.  Pain scale is 4 out of 10.  She states that she has not taken any antibiotics for this.  She denies any other acute complaints.   Review of Systems: Negative except as noted in the HPI. Denies N/V/F/Ch.  Past Medical History:  Diagnosis Date  . Anxiety   . Colon polyps   . Hypercholesteremia     Current Outpatient Medications:  .  meloxicam (MOBIC) 15 MG tablet, One tab PO qAM with a meal for 2 weeks, then daily prn pain., Disp: 30 tablet, Rfl: 3 .  neomycin-polymyxin-hydrocortisone (CORTISPORIN) OTIC solution, Apply 1-2 drops to toe after soaking twice a day, Disp: 10 mL, Rfl: 0 .  omeprazole (PRILOSEC) 20 MG capsule, Take by mouth., Disp: , Rfl:  .  doxycycline (VIBRA-TABS) 100 MG tablet, Take 1 tablet (100 mg total) by mouth 2 (two) times daily., Disp: 20 tablet, Rfl: 0  Social History   Tobacco Use  Smoking Status Current Every Day Smoker  . Packs/day: 1.50  Smokeless Tobacco Never Used    No Known Allergies Objective:  There were no vitals filed for this visit. There is no height or weight on file to calculate BMI. Constitutional Well developed. Well nourished.  Vascular Dorsalis pedis pulses palpable bilaterally. Posterior tibial pulses palpable bilaterally. Capillary refill normal  to all digits.  No cyanosis or clubbing noted. Pedal hair growth normal.  Neurologic Normal speech. Oriented to person, place, and time. Epicritic sensation to light touch grossly present bilaterally.  Dermatologic  pain on palpation to the left hallux.  There is some maceration around the hallux skin edges as well as the nailbed itself. No other open wounds. No skin lesions.  There are some mild erythema surrounding the hallux left.  Orthopedic: Normal joint ROM without pain or crepitus bilaterally. No visible deformities. No bony tenderness.   Radiographs: None Assessment:   1. Paronychia of toe of left foot due to ingrown toenail    Plan:  Patient was evaluated and treated and all questions answered.  Left great toe pain status post left hallux total nail avulsion -Clinically it appears the patient has mild erythema after undergoing a total nail avulsion, I believe that this is likely due to soft tissue infection.  I believe patient will benefit from doxycycline for skin and soft tissue prophylaxis.  I have asked the patient to stop doing soaking as well as keeping it dry as this is leading to maceration of the proximal nail fold as well as the nailbed itself.  She states understanding will do so. -I will see her back again in 2 weeks to evaluate if the infection has improved. No follow-ups on file.

## 2019-12-28 ENCOUNTER — Other Ambulatory Visit: Payer: Self-pay

## 2019-12-28 ENCOUNTER — Ambulatory Visit (INDEPENDENT_AMBULATORY_CARE_PROVIDER_SITE_OTHER): Payer: BC Managed Care – PPO | Admitting: Sports Medicine

## 2019-12-28 ENCOUNTER — Encounter: Payer: Self-pay | Admitting: Sports Medicine

## 2019-12-28 DIAGNOSIS — G8929 Other chronic pain: Secondary | ICD-10-CM

## 2019-12-28 DIAGNOSIS — M545 Low back pain, unspecified: Secondary | ICD-10-CM

## 2019-12-28 DIAGNOSIS — M7712 Lateral epicondylitis, left elbow: Secondary | ICD-10-CM | POA: Diagnosis not present

## 2019-12-28 NOTE — Assessment & Plan Note (Signed)
For few months this pleasant 54 year old female has occasional on and off pains at her left lateral elbow. This looks like tennis elbow though she is not having any symptoms today and the elbow exam is unremarkable. Adding tennis elbow rehab exercises, return to see me as needed.

## 2019-12-28 NOTE — Progress Notes (Signed)
    Procedures performed today:    None.  Independent interpretation of notes and tests performed by another provider:   I personally reviewed her lumbar spine x-rays which show multilevel lumbar DDD.  Impression and Recommendations:    Left lateral epicondylitis For few months this pleasant 54 year old female has occasional on and off pains at her left lateral elbow. This looks like tennis elbow though she is not having any symptoms today and the elbow exam is unremarkable. Adding tennis elbow rehab exercises, return to see me as needed.  Chronic low back pain Judith [name] returns, she is had years of axial low back pain, discogenic, symptoms improved considerably with meloxicam and home rehab exercises, continue the rehab, she feels as though she can live with her mild residual discomfort right now.     ___________________________________________ Judith [name]. Judith [name], M.D., ABFM., CAQSM. Primary Care and Sports Medicine [facility]  Adjunct Instructor of Family Medicine  [employer]

## 2019-12-28 NOTE — Assessment & Plan Note (Signed)
Kjirsten returns, she is had years of axial low back pain, discogenic, symptoms improved considerably with meloxicam and home rehab exercises, continue the rehab, she feels as though she can live with her mild residual discomfort right now.

## 2020-01-09 ENCOUNTER — Ambulatory Visit (INDEPENDENT_AMBULATORY_CARE_PROVIDER_SITE_OTHER): Payer: BC Managed Care – PPO | Admitting: Podiatry

## 2020-01-09 ENCOUNTER — Other Ambulatory Visit: Payer: Self-pay

## 2020-01-09 DIAGNOSIS — L03032 Cellulitis of left toe: Secondary | ICD-10-CM | POA: Diagnosis not present

## 2020-01-09 DIAGNOSIS — L600 Ingrowing nail: Secondary | ICD-10-CM | POA: Diagnosis not present

## 2020-01-10 ENCOUNTER — Encounter: Payer: Self-pay | Admitting: Podiatry

## 2020-01-10 NOTE — Progress Notes (Signed)
  Subjective:  Patient ID: Judith [name], female    DOB: 03/11/1966,  MRN: [id number]  Chief Complaint  Patient presents with  . Nail Problem    pt is here for a f/u of the left great toenail, pt is concerned that she is not healing correctly,pt also states that the left big toenail has been oozing as well as painful.    54 y.o. female presents with the above complaint.  Patient presents with toenail erythema around the left hallux after she had undergone a total nail avulsion by Dr. Charlsie Merles.  Patient states that it is not improving as quickly as she would like.  There is some still oozing present.  Patient has not been soaking in Epsom salt or using drops.  Pain scale 3 out of 10.  She has been soaking the foot.  She has been applying the triple antibiotic and a Band-Aid.  She denies any other acute complaints.   Review of Systems: Negative except as noted in the HPI. Denies N/V/F/Ch.  Past Medical History:  Diagnosis Date  . Anxiety   . Colon polyps   . Hypercholesteremia     Current Outpatient Medications:  .  doxycycline (VIBRA-TABS) 100 MG tablet, Take 1 tablet (100 mg total) by mouth 2 (two) times daily., Disp: 20 tablet, Rfl: 0 .  meloxicam (MOBIC) 15 MG tablet, One tab PO qAM with a meal for 2 weeks, then daily prn pain., Disp: 30 tablet, Rfl: 3 .  neomycin-polymyxin-hydrocortisone (CORTISPORIN) OTIC solution, Apply 1-2 drops to toe after soaking twice a day, Disp: 10 mL, Rfl: 0 .  omeprazole (PRILOSEC) 20 MG capsule, Take by mouth., Disp: , Rfl:   Social History   Tobacco Use  Smoking Status Current Every Day Smoker  . Packs/day: 1.50  Smokeless Tobacco Never Used    No Known Allergies Objective:  There were no vitals filed for this visit. There is no height or weight on file to calculate BMI. Constitutional Well developed. Well nourished.  Vascular Dorsalis pedis pulses palpable bilaterally. Posterior tibial pulses palpable bilaterally. Capillary refill normal to all  digits.  No cyanosis or clubbing noted. Pedal hair growth normal.  Neurologic Normal speech. Oriented to person, place, and time. Epicritic sensation to light touch grossly present bilaterally.  Dermatologic  pain on palpation to the left hallux.  There is some maceration around the hallux skin edges as well as the nailbed itself. No other open wounds. No skin lesions.  There are some mild erythema surrounding the hallux left.  Orthopedic: Normal joint ROM without pain or crepitus bilaterally. No visible deformities. No bony tenderness.   Radiographs: None Assessment:   1. Paronychia of toe of left foot due to ingrown toenail   2. Ingrown nail    Plan:  Patient was evaluated and treated and all questions answered.  Left great toe pain with maceration status post left hallux total nail avulsion -Clinically the redness/erythema from last visit was improved with doxycycline.  However the healing of the wound after total nail avulsion is slowly progressing.  Given that there is significant amount of maceration present around the nailbed itself I believe patient will benefit benefit from Betadine wet-to-dry dressing changes.  I have asked the patient to not get it wet.  Patient states understanding and will do so. -I will see her back again in 2 weeks to evaluate if the infection has improved. No follow-ups on file.

## 2020-01-30 ENCOUNTER — Other Ambulatory Visit: Payer: Self-pay

## 2020-01-30 ENCOUNTER — Ambulatory Visit (INDEPENDENT_AMBULATORY_CARE_PROVIDER_SITE_OTHER): Payer: BC Managed Care – PPO | Admitting: Podiatry

## 2020-01-30 DIAGNOSIS — L600 Ingrowing nail: Secondary | ICD-10-CM

## 2020-01-30 DIAGNOSIS — L03032 Cellulitis of left toe: Secondary | ICD-10-CM | POA: Diagnosis not present

## 2020-01-31 ENCOUNTER — Encounter: Payer: Self-pay | Admitting: Podiatry

## 2020-01-31 NOTE — Progress Notes (Signed)
  Subjective:  Patient ID: Judith [name], female    DOB: 1966-02-02,  MRN: [id number]  Chief Complaint  Patient presents with  . Nail Problem    pt is here for a 3 week f/u of the left great toenail, pt is concerned as far as the darkness, pt states she is not in pain, but states it feels weird when she is getting it wet    54 y.o. female presents with the above complaint..  Patient is erythema as well as maceration has completely resolved.  Her pain has considerably gone down.  She has been applying Betadine dressings daily to the foot which has helped considerably.  I have discussed with the patient today that she can stop doing dressing changes and applying Betadine.  She can start getting her foot wet.   Review of Systems: Negative except as noted in the HPI. Denies N/V/F/Ch.  Past Medical History:  Diagnosis Date  . Anxiety   . Colon polyps   . Hypercholesteremia     Current Outpatient Medications:  .  doxycycline (VIBRA-TABS) 100 MG tablet, Take 1 tablet (100 mg total) by mouth 2 (two) times daily., Disp: 20 tablet, Rfl: 0 .  meloxicam (MOBIC) 15 MG tablet, One tab PO qAM with a meal for 2 weeks, then daily prn pain., Disp: 30 tablet, Rfl: 3 .  neomycin-polymyxin-hydrocortisone (CORTISPORIN) OTIC solution, Apply 1-2 drops to toe after soaking twice a day, Disp: 10 mL, Rfl: 0 .  omeprazole (PRILOSEC) 20 MG capsule, Take by mouth., Disp: , Rfl:   Social History   Tobacco Use  Smoking Status Current Every Day Smoker  . Packs/day: 1.50  Smokeless Tobacco Never Used    No Known Allergies Objective:  There were no vitals filed for this visit. There is no height or weight on file to calculate BMI. Constitutional Well developed. Well nourished.  Vascular Dorsalis pedis pulses palpable bilaterally. Posterior tibial pulses palpable bilaterally. Capillary refill normal to all digits.  No cyanosis or clubbing noted. Pedal hair growth normal.  Neurologic Normal speech. Oriented to  person, place, and time. Epicritic sensation to light touch grossly present bilaterally.  Dermatologic  no pain on palpation to the left hallux.  There is no maceration around the hallux skin edges as well as the nailbed itself. No other open wounds. No skin lesions.  There no erythema surrounding the hallux left.  Orthopedic: Normal joint ROM without pain or crepitus bilaterally. No visible deformities. No bony tenderness.   Radiographs: None Assessment:   1. Paronychia of toe of left foot due to ingrown toenail    Plan:  Patient was evaluated and treated and all questions answered.  Left great toe pain with maceration status post left hallux total nail avulsion -The maceration has completely improved with Betadine wet-to-dry dressing changes.  Patient pain has also considerably decreased. -I discussed with the patient that if any foot and ankle issues arises come back and see me or Dr. Charlsie [name]. No follow-ups on file.

## 2020-05-13 ENCOUNTER — Telehealth: Payer: Self-pay | Admitting: Sports Medicine

## 2020-05-13 DIAGNOSIS — Z720 Tobacco use: Secondary | ICD-10-CM

## 2020-05-13 MED ORDER — CHANTIX STARTING MONTH PAK 0.5 MG X 11 & 1 MG X 42 PO TABS: ORAL_TABLET | ORAL | 0 refills | Status: DC

## 2020-05-13 NOTE — Telephone Encounter (Signed)
Judith [name]'s husband has metastatic cancer, she is interested in [facility] and wanted me to call this in during the appointment for her husband, I am happy to do this.  Further follow-up really needs to be with PCP.

## 2020-05-14 ENCOUNTER — Other Ambulatory Visit: Payer: Self-pay | Admitting: Sports Medicine

## 2020-05-14 MED ORDER — CHANTIX STARTING MONTH PAK 0.5 MG X 11 & 1 MG X 42 PO TABS: ORAL_TABLET | ORAL | 0 refills | Status: DC

## 2020-05-16 ENCOUNTER — Other Ambulatory Visit: Payer: Self-pay | Admitting: Sports Medicine

## 2020-05-16 MED ORDER — NICOTINE 7 MG/24HR TD PT24: 7.0000 mg | MEDICATED_PATCH | Freq: Every day | TRANSDERMAL | 0 refills | Status: DC

## 2020-06-19 ENCOUNTER — Encounter: Payer: Self-pay | Admitting: Osteopathic Medicine

## 2020-08-08 ENCOUNTER — Encounter: Payer: Self-pay | Admitting: Osteopathic Medicine

## 2020-08-08 ENCOUNTER — Other Ambulatory Visit: Payer: Self-pay | Admitting: Osteopathic Medicine

## 2020-08-08 DIAGNOSIS — Z1231 Encounter for screening mammogram for malignant neoplasm of breast: Secondary | ICD-10-CM

## 2020-08-11 ENCOUNTER — Telehealth: Payer: Self-pay

## 2020-08-11 NOTE — Telephone Encounter (Signed)
Requesting referral for DEXA scan from PCP.  Message has been forwarded.

## 2020-08-11 NOTE — Telephone Encounter (Signed)
I discuss bone density testing and order this screening at annual physicals

## 2020-08-13 NOTE — Telephone Encounter (Signed)
Attempted to contact Judith [name] to advise her of Dr. Mardelle Matte response concerning the DEXA scan request. LVM for return call for information.

## 2020-08-14 ENCOUNTER — Ambulatory Visit (INDEPENDENT_AMBULATORY_CARE_PROVIDER_SITE_OTHER): Payer: BC Managed Care – PPO

## 2020-08-14 ENCOUNTER — Other Ambulatory Visit: Payer: Self-pay

## 2020-08-14 DIAGNOSIS — Z1231 Encounter for screening mammogram for malignant neoplasm of breast: Secondary | ICD-10-CM | POA: Diagnosis not present

## 2020-08-18 ENCOUNTER — Other Ambulatory Visit: Payer: Self-pay | Admitting: Osteopathic Medicine

## 2020-08-18 DIAGNOSIS — R928 Other abnormal and inconclusive findings on diagnostic imaging of breast: Secondary | ICD-10-CM

## 2020-09-02 ENCOUNTER — Ambulatory Visit
Admission: RE | Admit: 2020-09-02 | Discharge: 2020-09-02 | Disposition: A | Discharge status: Home / Self Care | Payer: BC Managed Care – PPO | Source: Ambulatory Visit | Attending: Osteopathic Medicine | Admitting: Osteopathic Medicine

## 2020-09-02 ENCOUNTER — Other Ambulatory Visit: Payer: Self-pay

## 2020-09-02 DIAGNOSIS — R928 Other abnormal and inconclusive findings on diagnostic imaging of breast: Secondary | ICD-10-CM

## 2020-09-02 DIAGNOSIS — N6489 Other specified disorders of breast: Secondary | ICD-10-CM | POA: Diagnosis not present

## 2020-09-02 DIAGNOSIS — R922 Inconclusive mammogram: Secondary | ICD-10-CM | POA: Diagnosis not present

## 2020-10-02 ENCOUNTER — Other Ambulatory Visit: Payer: Self-pay | Admitting: Osteopathic Medicine

## 2020-10-02 DIAGNOSIS — R928 Other abnormal and inconclusive findings on diagnostic imaging of breast: Secondary | ICD-10-CM

## 2020-10-06 ENCOUNTER — Encounter: Payer: BC Managed Care – PPO | Admitting: Osteopathic Medicine

## 2020-10-06 ENCOUNTER — Other Ambulatory Visit (HOSPITAL_COMMUNITY)
Admission: RE | Admit: 2020-10-06 | Discharge: 2020-10-06 | Disposition: A | Discharge status: Home / Self Care | Payer: BC Managed Care – PPO | Source: Ambulatory Visit | Attending: Osteopathic Medicine | Admitting: Osteopathic Medicine

## 2020-10-06 ENCOUNTER — Encounter: Payer: Self-pay | Admitting: Medical-Surgical

## 2020-10-06 ENCOUNTER — Other Ambulatory Visit: Payer: Self-pay

## 2020-10-06 ENCOUNTER — Ambulatory Visit (INDEPENDENT_AMBULATORY_CARE_PROVIDER_SITE_OTHER): Payer: BC Managed Care – PPO | Admitting: Medical-Surgical

## 2020-10-06 ENCOUNTER — Ambulatory Visit (HOSPITAL_BASED_OUTPATIENT_CLINIC_OR_DEPARTMENT_OTHER)
Admission: RE | Admit: 2020-10-06 | Discharge: 2020-10-06 | Disposition: A | Discharge status: Home / Self Care | Payer: BC Managed Care – PPO | Source: Ambulatory Visit | Attending: Medical-Surgical | Admitting: Medical-Surgical

## 2020-10-06 VITALS — BP 102/69 | HR 80 | Temp 98.6°F | Ht 69.0 in | Wt 141.4 lb

## 2020-10-06 DIAGNOSIS — Z13820 Encounter for screening for osteoporosis: Secondary | ICD-10-CM | POA: Insufficient documentation

## 2020-10-06 DIAGNOSIS — Z114 Encounter for screening for human immunodeficiency virus [HIV]: Secondary | ICD-10-CM

## 2020-10-06 DIAGNOSIS — Z780 Asymptomatic menopausal state: Secondary | ICD-10-CM | POA: Diagnosis not present

## 2020-10-06 DIAGNOSIS — Z1151 Encounter for screening for human papillomavirus (HPV): Secondary | ICD-10-CM | POA: Diagnosis not present

## 2020-10-06 DIAGNOSIS — Z1159 Encounter for screening for other viral diseases: Secondary | ICD-10-CM

## 2020-10-06 DIAGNOSIS — Z124 Encounter for screening for malignant neoplasm of cervix: Secondary | ICD-10-CM | POA: Insufficient documentation

## 2020-10-06 DIAGNOSIS — Z0000 Encounter for general adult medical examination without abnormal findings: Secondary | ICD-10-CM

## 2020-10-06 DIAGNOSIS — M8589 Other specified disorders of bone density and structure, multiple sites: Secondary | ICD-10-CM | POA: Diagnosis not present

## 2020-10-06 DIAGNOSIS — E2839 Other primary ovarian failure: Secondary | ICD-10-CM | POA: Diagnosis not present

## 2020-10-06 NOTE — Progress Notes (Signed)
HPI: Judith [name] is a 54 y.o. female who  has a past medical history of Anxiety, Colon polyps, and Hypercholesteremia.  she presents to [facility] today, 10/06/20,  for chief complaint of: Annual physical exam  Dentist: last week, getting concerns fixed Eye exam: April 2021, glasses Exercise: none  Diet: well-balanced, trying to eat healthier Pap smear: doing today, 1.5 yrs ago, abnormals in the past Mammogram: done last month Colon cancer screening: UTD COVID vaccine: done  Concerns: None  Past medical, surgical, social and family history reviewed:  Patient Active Problem List   Diagnosis Date Noted  . Left lateral epicondylitis 12/28/2019  . Chronic low back pain 11/16/2019  . Leukocytosis 10/30/2019  . Elevated hemoglobin ([place]) 10/30/2019  . Tobacco abuse 06/13/2017  . Atypical chest pain 05/30/2017  . Gastroesophageal reflux disease without esophagitis 05/30/2017  . Mixed hyperlipidemia 05/30/2017    History reviewed. No pertinent surgical history.  Social History   Tobacco Use  . Smoking status: Current Every Day Smoker    Packs/day: 0.50  . Smokeless tobacco: Never Used  . Tobacco comment: also uses nicoteine patch  Substance Use Topics  . Alcohol use: Yes    Comment: rarely    Family History  Problem Relation Age of Onset  . Heart attack Mother   . Alcoholism Mother   . Heart attack Father   . Alcoholism Father   . Hyperlipidemia Brother   . Stroke Brother   . Osteopenia Sister      Current medication list and allergy/intolerance information reviewed:    Current Outpatient Medications  Medication Sig Dispense Refill  . nicotine (NICODERM CQ - DOSED IN MG/24 HR) 7 mg/24hr patch Place 1 patch (7 mg total) onto the skin daily. 28 patch 0   No current facility-administered medications for this visit.    No Known Allergies    Review of Systems:  Constitutional:  No  fever, no chills, No recent illness, No  unintentional weight changes. No significant fatigue.   HEENT: No  headache, no vision change, no hearing change, No sore throat, No  sinus pressure  Cardiac: No  chest pain, No  pressure, No palpitations, No  Orthopnea  Respiratory:  No  shortness of breath. No  Cough  Gastrointestinal: No  abdominal pain, No  nausea, No  vomiting,  No  blood in stool, No  diarrhea, No  constipation   Musculoskeletal: No new myalgia/arthralgia  Skin: No  Rash, No other wounds/concerning lesions  Genitourinary: No  incontinence, No  abnormal genital bleeding, No abnormal genital discharge  Hem/Onc: No  easy bruising/bleeding, No  abnormal lymph node  Endocrine: No cold intolerance,  No heat intolerance. No polyuria/polydipsia/polyphagia   Neurologic: No  weakness, No  dizziness, No  slurred speech/focal weakness/facial droop  Psychiatric: No  concerns with depression, No  concerns with anxiety, No sleep problems, No mood problems  Exam:  BP 102/69   Pulse 80   Temp 98.6 F (37 C)   Ht _0  (1.753 m)   Wt 141 lb 6.4 oz (64.1 kg)   LMP 04/14/2017   SpO2 97%   BMI 20.88 kg/m   Constitutional: VS see above. General Appearance: alert, well-developed, well-nourished, NAD  Eyes: Normal lids and conjunctive, non-icteric sclera  Ears, Nose, Mouth, Throat: MMM, Normal external inspection ears/nares/mouth/lips/gums. TM normal bilaterally.  Neck: No masses, trachea midline. No thyroid enlargement. No tenderness/mass appreciated. No lymphadenopathy  Respiratory: Normal respiratory effort. no wheeze, no rhonchi, no  rales  Cardiovascular: S1/S2 normal, no murmur, no rub/gallop auscultated. RRR. No lower extremity edema. No carotid bruit or JVD. No abdominal aortic bruit.  Gastrointestinal: Nontender, no masses. No hepatomegaly, no splenomegaly. No hernia appreciated. Bowel sounds normal. Rectal exam deferred.   Musculoskeletal: Gait normal. No clubbing/cyanosis of digits.   Neurological: Normal  balance/coordination. No tremor. No cranial nerve deficit on limited exam. Motor and sensation intact and symmetric. Cerebellar reflexes intact.   Skin: warm, dry, intact. No rash/ulcer. No concerning nevi or subq nodules on limited exam.    Psychiatric: Normal judgment/insight. Normal mood and affect. Oriented x3.   No results found for this or any previous visit (from the past 72 hour(s)).  No results found.  ASSESSMENT/PLAN:   1. Annual physical exam Checking CBC, CMP, and Lipid panel.  2. Screening for cervical cancer Pap smear with HPV co-testing today.  - Cytology - PAP  3. Screening for HIV/Hepatitis C Discussed screening recommendations. Patient agreeable so adding to blood work today.  4. Screening for osteoporosis DEXA scan ordered today.  Orders Placed This Encounter  Procedures  . DG Bone Density  . Hepatitis C antibody  . HIV Antibody (routine testing w rflx)  . CBC  . COMPLETE METABOLIC PANEL WITH GFR  . Lipid panel   No orders of the defined types were placed in this encounter.  Patient Instructions  Preventive Care 42-23 Years Old, Female Preventive care refers to visits with your health care provider and lifestyle choices that can promote health and wellness. This includes:  A yearly physical exam. This may also be called an annual well check.  Regular dental visits and eye exams.  Immunizations.  Screening for certain conditions.  Healthy lifestyle choices, such as eating a healthy diet, getting regular exercise, not using drugs or products that contain nicotine and tobacco, and limiting alcohol use. What can I expect for my preventive care visit? Physical exam Your health care provider will check your:  Height and weight. This may be used to calculate body mass index (BMI), which tells if you are at a healthy weight.  Heart rate and blood pressure.  Skin for abnormal spots. Counseling Your health care provider may ask you questions about  your:  Alcohol, tobacco, and drug use.  Emotional well-being.  Home and relationship well-being.  Sexual activity.  Eating habits.  Work and work Statistician.  Method of birth control.  Menstrual cycle.  Pregnancy history. What immunizations do I need?  Influenza (flu) vaccine  This is recommended every year. Tetanus, diphtheria, and pertussis (Tdap) vaccine  You may need a Td booster every 10 years. Varicella (chickenpox) vaccine  You may need this if you have not been vaccinated. Zoster (shingles) vaccine  You may need this after age 57. Measles, mumps, and rubella (MMR) vaccine  You may need at least one dose of MMR if you were born in 1957 or later. You may also need a second dose. Pneumococcal conjugate (PCV13) vaccine  You may need this if you have certain conditions and were not previously vaccinated. Pneumococcal polysaccharide (PPSV23) vaccine  You may need one or two doses if you smoke cigarettes or if you have certain conditions. Meningococcal conjugate (MenACWY) vaccine  You may need this if you have certain conditions. Hepatitis A vaccine  You may need this if you have certain conditions or if you travel or work in places where you may be exposed to hepatitis A. Hepatitis B vaccine  You may need this if you  have certain conditions or if you travel or work in places where you may be exposed to hepatitis B. Haemophilus influenzae type b (Hib) vaccine  You may need this if you have certain conditions. Human papillomavirus (HPV) vaccine  If recommended by your health care provider, you may need three doses over 6 months. You may receive vaccines as individual doses or as more than one vaccine together in one shot (combination vaccines). Talk with your health care provider about the risks and benefits of combination vaccines. What tests do I need? Blood tests  Lipid and cholesterol levels. These may be checked every 5 years, or more frequently if  you are over 27 years old.  Hepatitis C test.  Hepatitis B test. Screening  Lung cancer screening. You may have this screening every year starting at age 38 if you have a 30-pack-year history of smoking and currently smoke or have quit within the past 15 years.  Colorectal cancer screening. All adults should have this screening starting at age 55 and continuing until age 64. Your health care provider may recommend screening at age 62 if you are at increased risk. You will have tests every 1-10 years, depending on your results and the type of screening test.  Diabetes screening. This is done by checking your blood sugar (glucose) after you have not eaten for a while (fasting). You may have this done every 1-3 years.  Mammogram. This may be done every 1-2 years. Talk with your health care provider about when you should start having regular mammograms. This may depend on whether you have a family history of breast cancer.  BRCA-related cancer screening. This may be done if you have a family history of breast, ovarian, tubal, or peritoneal cancers.  Pelvic exam and Pap test. This may be done every 3 years starting at age 45. Starting at age 55, this may be done every 5 years if you have a Pap test in combination with an HPV test. Other tests  Sexually transmitted disease (STD) testing.  Bone density scan. This is done to screen for osteoporosis. You may have this scan if you are at high risk for osteoporosis. Follow these instructions at home: Eating and drinking  Eat a diet that includes fresh fruits and vegetables, whole grains, lean protein, and low-fat dairy.  Take vitamin and mineral supplements as recommended by your health care provider.  Do not drink alcohol if: ? Your health care provider tells you not to drink. ? You are pregnant, may be pregnant, or are planning to become pregnant.  If you drink alcohol: ? Limit how much you have to 0-1 drink a day. ? Be aware of how much  alcohol is in your drink. In the [place] one drink equals one 12 oz bottle of beer (355 mL), one 5 oz glass of wine (148 mL), or one 1 oz glass of hard liquor (44 mL). Lifestyle  Take daily care of your teeth and gums.  Stay active. Exercise for at least 30 minutes on 5 or more days each week.  Do not use any products that contain nicotine or tobacco, such as cigarettes, e-cigarettes, and chewing tobacco. If you need help quitting, ask your health care provider.  If you are sexually active, practice safe sex. Use a condom or other form of birth control (contraception) in order to prevent pregnancy and STIs (sexually transmitted infections).  If told by your health care provider, take low-dose aspirin daily starting at age 72. What's next?  Visit your health care provider once a year for a well check visit.  Ask your health care provider how often you should have your eyes and teeth checked.  Stay up to date on all vaccines. This information is not intended to replace advice given to you by your health care provider. Make sure you discuss any questions you have with your health care provider. Document Revised: 06/22/2018 Document Reviewed: 06/22/2018 Elsevier Patient Education  [place].  Follow-up plan: Return in about 1 year (around 10/06/2021) for annual physical exam.  Clearnce Sorrel, DNP, APRN, FNP-BC [facility]

## 2020-10-06 NOTE — Patient Instructions (Signed)

## 2020-10-07 ENCOUNTER — Telehealth: Payer: Self-pay

## 2020-10-07 LAB — HEPATITIS C ANTIBODY
Hepatitis C Ab: NONREACTIVE
SIGNAL TO CUT-OFF: 0.02 (ref <1.00)

## 2020-10-07 LAB — COMPLETE METABOLIC PANEL WITH GFR
AG Ratio: 1.5 (calc) (ref 1.0–2.5)
ALT: 17 U/L (ref 6–29)
AST: 17 U/L (ref 10–35)
Albumin: 4.4 g/dL (ref 3.6–5.1)
Alkaline phosphatase (APISO): 125 U/L (ref 37–153)
BUN: 17 mg/dL (ref 7–25)
CO2: 30 mmol/L (ref 20–32)
Calcium: 10.2 mg/dL (ref 8.6–10.4)
Chloride: 104 mmol/L (ref 98–110)
Creat: 0.79 mg/dL (ref 0.50–1.05)
GFR, Est African American: 98 mL/min/{1.73_m2} (ref >=60)
GFR, Est Non African American: 84 mL/min/{1.73_m2} (ref >=60)
Globulin: 2.9 g/dL (calc) (ref 1.9–3.7)
Glucose, Bld: 80 mg/dL (ref 65–99)
Potassium: 5.1 mmol/L (ref 3.5–5.3)
Sodium: 141 mmol/L (ref 135–146)
Total Bilirubin: 0.4 mg/dL (ref 0.2–1.2)
Total Protein: 7.3 g/dL (ref 6.1–8.1)

## 2020-10-07 LAB — CBC
HCT: 43.8 % (ref 35.0–45.0)
Hemoglobin: 15 g/dL (ref 11.7–15.5)
MCH: 30.8 pg (ref 27.0–33.0)
MCHC: 34.2 g/dL (ref 32.0–36.0)
MCV: 89.9 fL (ref 80.0–100.0)
MPV: 9.8 fL (ref 7.5–12.5)
Platelets: 307 10*3/uL (ref 140–400)
RBC: 4.87 10*6/uL (ref 3.80–5.10)
RDW: 12.7 % (ref 11.0–15.0)
WBC: 11 10*3/uL — ABNORMAL HIGH (ref 3.8–10.8)

## 2020-10-07 LAB — LIPID PANEL
Cholesterol: 248 mg/dL — ABNORMAL HIGH (ref <200)
HDL: 49 mg/dL — ABNORMAL LOW (ref >=50)
LDL Cholesterol (Calc): 172 mg/dL (calc) — ABNORMAL HIGH
Non-HDL Cholesterol (Calc): 199 mg/dL (calc) — ABNORMAL HIGH (ref <130)
Total CHOL/HDL Ratio: 5.1 (calc) — ABNORMAL HIGH (ref <5.0)
Triglycerides: 135 mg/dL (ref <150)

## 2020-10-07 LAB — CYTOLOGY - PAP
Comment: NEGATIVE
Diagnosis: NEGATIVE
High risk HPV: NEGATIVE

## 2020-10-07 LAB — HIV ANTIBODY (ROUTINE TESTING W REFLEX): HIV 1&2 Ab, 4th Generation: NONREACTIVE

## 2020-10-07 NOTE — Telephone Encounter (Signed)
This is fine with me   

## 2020-10-07 NOTE — Telephone Encounter (Signed)
Pt is requesting to change PCP's within the practice:   Current PCP: Dr. Lyn Hollingshead Requested PCP: Christen Butter, FNP  Pt is wanting to change PCP's stating that she has never seen Dr. Lyn Hollingshead in person and now that she has seen Joy in office for her CPE, she wants Joy to be her new PCP.    Please advise regarding approval/denial.

## 2020-10-08 ENCOUNTER — Encounter: Payer: Self-pay | Admitting: Medical-Surgical

## 2020-10-08 NOTE — Telephone Encounter (Signed)
Pt aware that both providers approved her changing PCP's. This has been updated in her chart.   Pt states that she wants Joy to address the cholesterol treatment.

## 2020-10-08 NOTE — Telephone Encounter (Signed)
Switch is fine w/ me

## 2020-10-08 NOTE — Telephone Encounter (Signed)
Direct message sent to patient via MyChart.

## 2020-11-12 ENCOUNTER — Encounter: Payer: Self-pay | Admitting: Medical-Surgical

## 2020-11-12 ENCOUNTER — Ambulatory Visit (INDEPENDENT_AMBULATORY_CARE_PROVIDER_SITE_OTHER): Payer: BC Managed Care – PPO

## 2020-11-12 ENCOUNTER — Ambulatory Visit (INDEPENDENT_AMBULATORY_CARE_PROVIDER_SITE_OTHER): Payer: BC Managed Care – PPO | Admitting: Medical-Surgical

## 2020-11-12 ENCOUNTER — Other Ambulatory Visit: Payer: Self-pay

## 2020-11-12 VITALS — BP 113/74 | HR 91 | Temp 97.6°F | Ht 69.0 in | Wt 146.6 lb

## 2020-11-12 DIAGNOSIS — M79604 Pain in right leg: Secondary | ICD-10-CM

## 2020-11-12 DIAGNOSIS — E782 Mixed hyperlipidemia: Secondary | ICD-10-CM | POA: Diagnosis not present

## 2020-11-12 DIAGNOSIS — M50322 Other cervical disc degeneration at C5-C6 level: Secondary | ICD-10-CM

## 2020-11-12 DIAGNOSIS — M79605 Pain in left leg: Secondary | ICD-10-CM

## 2020-11-12 DIAGNOSIS — G8929 Other chronic pain: Secondary | ICD-10-CM

## 2020-11-12 DIAGNOSIS — R222 Localized swelling, mass and lump, trunk: Secondary | ICD-10-CM | POA: Diagnosis not present

## 2020-11-12 DIAGNOSIS — R29898 Other symptoms and signs involving the musculoskeletal system: Secondary | ICD-10-CM | POA: Diagnosis not present

## 2020-11-12 DIAGNOSIS — M25561 Pain in right knee: Secondary | ICD-10-CM

## 2020-11-12 DIAGNOSIS — F5101 Primary insomnia: Secondary | ICD-10-CM | POA: Insufficient documentation

## 2020-11-12 DIAGNOSIS — M47812 Spondylosis without myelopathy or radiculopathy, cervical region: Secondary | ICD-10-CM | POA: Diagnosis not present

## 2020-11-12 DIAGNOSIS — R531 Weakness: Secondary | ICD-10-CM | POA: Diagnosis not present

## 2020-11-12 MED ORDER — GABAPENTIN 100 MG PO CAPS: 100.0000 mg | ORAL_CAPSULE | Freq: Three times a day (TID) | ORAL | 3 refills | Status: DC

## 2020-11-12 MED ORDER — ATORVASTATIN CALCIUM 20 MG PO TABS: 20.0000 mg | ORAL_TABLET | Freq: Every day | ORAL | 3 refills | Status: DC

## 2020-11-12 MED ORDER — MELOXICAM 15 MG PO TABS: 15.0000 mg | ORAL_TABLET | Freq: Every day | ORAL | 0 refills | Status: DC

## 2020-11-12 NOTE — Progress Notes (Signed)
Subjective:    CC: Insomnia, leg/hand pain  HPI: Pleasant 55 year old female presenting today for evaluation of the following:  Insomnia-notes that she has difficulty falling asleep as well as staying asleep.  Wakes up frequently throughout the night when she is able to finally fall asleep.  This has been going on for quite a while and she has not found anything available over-the-counter that was helpful.  Leg pain-Notes that she has a deep ache in her bilateral legs that affects her lower legs more than anything else but does extend into her upper legs.  Notes that the sensation is not pins-and-needles or a crawling sensation.  She elevates her legs which helps some and she has tried ibuprofen but not regularly taking it.  Denies saddle paresthesias, new onset incontinence, and leg weakness.  No difficulty with ambulation.  Notes that her right knee has been bothering her for a long time but it has recently gotten much worse.  Arm/hand pain-she does have some medial epicondyle pain over the left left arm that has been going on for a while.  She was previously evaluated and told this was tennis elbow but notes that she does not use her left arm most of the time for regular activities.  She is strongly right arm dominant.  Has noted recently that her right hand is weaker than usual and that she is now having to use kitchen aids to be able to open jars.  I reviewed the past medical history, family history, social history, surgical history, and allergies today and no changes were needed.  Please see the problem list section below in epic for further details.  Past Medical History: Past Medical History:  Diagnosis Date  . Anxiety   . Colon polyps   . Hypercholesteremia    Past Surgical History: History reviewed. No pertinent surgical history. Social History: Social History   Socioeconomic History  . Marital status: Married    Spouse name: Kacie Huxtable  . Number of children: 3  . Years  of education: Not on file  . Highest education level: Not on file  Occupational History    Employer: [employer]   Tobacco Use  . Smoking status: Current Every Day Smoker    Packs/day: 0.50  . Smokeless tobacco: Never Used  . Tobacco comment: also uses nicoteine patch  Vaping Use  . Vaping Use: Never used  Substance and Sexual Activity  . Alcohol use: Yes    Comment: rarely  . Drug use: Not Currently    Types: Marijuana  . Sexual activity: Yes    Partners: Male    Birth control/protection: Post-menopausal  Other Topics Concern  . Not on file  Social History Narrative  . Not on file   Social Determinants of Health   Financial Resource Strain: Not on file  Food Insecurity: Not on file  Transportation Needs: Not on file  Physical Activity: Not on file  Stress: Not on file  Social Connections: Not on file   Family History: Family History  Problem Relation Age of Onset  . Heart attack Mother   . Alcoholism Mother   . Heart attack Father   . Alcoholism Father   . Hyperlipidemia Brother   . Stroke Brother   . Osteopenia Sister    Allergies: No Known Allergies Medications: See med rec.  Review of Systems: See HPI for pertinent positives and negatives.   Objective:    General: Well Developed, well nourished, and in no acute distress.  Neuro: Alert  and oriented x3.  Hand grips equal bilaterally. HEENT: Normocephalic, atraumatic.  Skin: Warm and dry. Cardiac: Regular rate and rhythm, no murmurs rubs or gallops, no lower extremity edema.  Respiratory: Clear to auscultation bilaterally. Not using accessory muscles, speaking in full sentences. MSK: Strength bilateral legs 5 of 5.  No crepitus, popping, catching, or locking to the right knee.  Tenderness to palpation along the medial distal aspect of the knee joint.  Left arm tenderness along the medial epicondyle.  Full range of motion to bilateral upper and lower extremities.  Impression and Recommendations:    1. Mixed  hyperlipidemia Since her atorvastatin never made it to the pharmacy and to her, we are resending atorvastatin 20 mg daily.  Plan for recheck of lipid panel and CMP in 6 weeks after being on the medication. - atorvastatin (LIPITOR) 20 MG tablet; Take 1 tablet (20 mg total) by mouth daily.  Dispense: 90 tablet; Refill: 3 - Lipid panel; Future - COMPLETE METABOLIC PANEL WITH GFR; Future  2. Leg pain, bilateral Checking vitamin D today.  Unclear etiology of leg symptoms.  Consider restless leg syndrome versus lumbar radiculopathy versus neuropathy versus vitamin D deficiency.  Discussed treatment options.  We will go ahead and start with gabapentin 100 mg nightly for 1 week then increase to 200 mg nightly.  If she does not receive any relief using gabapentin, also sending in meloxicam to try for 2 weeks daily then daily as needed. - VITAMIN D 25 Hydroxy (Vit-D Deficiency, Fractures)  3. Right hand weakness Right hand weakness is subjective as her exam does not show any grip deficiencies of either side.  We will go ahead and get an x-ray of her cervical spine as I suspect her arm symptoms are stemming from neck concerns.  See above for prescribed medications. - DG Cervical Spine Complete; Future  4. Chronic pain of right knee X-rays of the right knee today.  See above for medications for pain management. - DG Knee Complete 4 Views Right; Future  5.  Insomnia Starting gabapentin 100 mg nightly as above with increase in nightly dose as tolerated.  Given the sedative nature of this medication suspect this will likely help with both sleep onset and sleep maintenance.  Return in about 4 weeks (around 12/10/2020) for leg pain/insomnia follow up. ___________________________________________ Thayer Ohm, DNP, APRN, FNP-BC Primary [facility] and Sports Medicine [facility]

## 2020-11-13 LAB — VITAMIN D 25 HYDROXY (VIT D DEFICIENCY, FRACTURES): Vit D, 25-Hydroxy: 20 ng/mL — ABNORMAL LOW (ref 30–100)

## 2020-12-10 ENCOUNTER — Encounter: Payer: Self-pay | Admitting: Medical-Surgical

## 2020-12-10 ENCOUNTER — Telehealth (INDEPENDENT_AMBULATORY_CARE_PROVIDER_SITE_OTHER): Payer: BC Managed Care – PPO | Admitting: Medical-Surgical

## 2020-12-10 VITALS — Temp 96.6°F

## 2020-12-10 DIAGNOSIS — F5101 Primary insomnia: Secondary | ICD-10-CM | POA: Diagnosis not present

## 2020-12-10 DIAGNOSIS — M79605 Pain in left leg: Secondary | ICD-10-CM | POA: Diagnosis not present

## 2020-12-10 DIAGNOSIS — M79604 Pain in right leg: Secondary | ICD-10-CM | POA: Diagnosis not present

## 2020-12-10 MED ORDER — MELOXICAM 15 MG PO TABS: 15.0000 mg | ORAL_TABLET | Freq: Every day | ORAL | 0 refills | Status: DC

## 2020-12-10 MED ORDER — GABAPENTIN 100 MG PO CAPS
200.0000 mg | ORAL_CAPSULE | Freq: Every evening | ORAL | 1 refills | Status: DC | PRN
Start: 2020-12-10 — End: 2021-12-18

## 2020-12-10 NOTE — Progress Notes (Signed)
Virtual Visit via Video Note  I connected with Judith [name] on 12/10/20 at  3:00 PM EST by a video enabled telemedicine application and verified that I am speaking with the correct person using two identifiers.   I discussed the limitations of evaluation and management by telemedicine and the availability of in person appointments. The patient expressed understanding and agreed to proceed.  Patient location: home Provider locations: office  Subjective:    CC: leg pain/insomnia follow up  HPI: Pleasant 55 year old female presenting via MyChart video visit for a follow up on her leg pain and insomnia.   Leg pain- since starting Meloxicam and Gabapentin, has noted her leg pain has improved. Still has some pain on some days but not every day, mainly involving her hands and feet, not as severe as before. Tolerating both medications well.   Insomnia- Taking Gabapentin at bedtime. Notes she took 200mg  a few nights by mistake and it was very helpful with sleep. Once she realized her mistake, she went down to 100mg  nightly but this hasn't been as helpful. No side effects noted.   Past medical history, Surgical history, Family history not pertinant except as noted below, Social history, Allergies, and medications have been entered into the medical record, reviewed, and corrections made.   Review of Systems: See HPI for pertinent positives and negatives.   Objective:    General: Speaking clearly in complete sentences without any shortness of breath.  Alert and oriented x3.  Normal judgment. No apparent acute distress.  Impression and Recommendations:    1. Leg pain, bilateral Improvement with Meloxicam and Gabapentin. Continue Meloxicam as prescribed. Increase Gabapentin to 200-300mg  nightly at bedtime. If still having pain, can consider a small dose of gabapentin in the morning.   2. Primary insomnia Increase gabapentin to 200-300mg  nightly.   I discussed the assessment and treatment plan  with the patient. The patient was provided an opportunity to ask questions and all were answered. The patient agreed with the plan and demonstrated an understanding of the instructions.   The patient was advised to call back or seek an in-person evaluation if the symptoms worsen or if the condition fails to improve as anticipated.  20 minutes of non-face-to-face time was provided during this encounter.  Return if symptoms worsen or fail to improve.  , DNP, APRN, FNP-BC [facility] and [facility]

## 2021-03-04 ENCOUNTER — Other Ambulatory Visit: Payer: Self-pay | Admitting: Osteopathic Medicine

## 2021-03-04 ENCOUNTER — Ambulatory Visit
Admission: RE | Admit: 2021-03-04 | Discharge: 2021-03-04 | Disposition: A | Discharge status: Home / Self Care | Payer: BC Managed Care – PPO | Source: Ambulatory Visit | Attending: Osteopathic Medicine | Admitting: Osteopathic Medicine

## 2021-03-04 ENCOUNTER — Other Ambulatory Visit: Payer: Self-pay

## 2021-03-04 DIAGNOSIS — R922 Inconclusive mammogram: Secondary | ICD-10-CM | POA: Diagnosis not present

## 2021-03-04 DIAGNOSIS — R928 Other abnormal and inconclusive findings on diagnostic imaging of breast: Secondary | ICD-10-CM

## 2021-03-12 ENCOUNTER — Other Ambulatory Visit: Payer: Self-pay | Admitting: Medical-Surgical

## 2021-06-09 ENCOUNTER — Other Ambulatory Visit: Payer: Self-pay

## 2021-06-09 ENCOUNTER — Other Ambulatory Visit: Payer: Self-pay | Admitting: Medical-Surgical

## 2021-06-09 DIAGNOSIS — E782 Mixed hyperlipidemia: Secondary | ICD-10-CM

## 2021-06-09 NOTE — Progress Notes (Signed)
Labs for cholesterol meds follow up.

## 2021-06-12 ENCOUNTER — Other Ambulatory Visit: Payer: Self-pay | Admitting: Medical-Surgical

## 2021-07-05 ENCOUNTER — Other Ambulatory Visit: Payer: Self-pay | Admitting: Medical-Surgical

## 2021-07-05 DIAGNOSIS — E782 Mixed hyperlipidemia: Secondary | ICD-10-CM

## 2021-08-27 ENCOUNTER — Other Ambulatory Visit: Payer: Self-pay

## 2021-08-27 ENCOUNTER — Ambulatory Visit (INDEPENDENT_AMBULATORY_CARE_PROVIDER_SITE_OTHER): Payer: BC Managed Care – PPO

## 2021-08-27 ENCOUNTER — Ambulatory Visit (INDEPENDENT_AMBULATORY_CARE_PROVIDER_SITE_OTHER): Payer: BC Managed Care – PPO | Admitting: Sports Medicine

## 2021-08-27 DIAGNOSIS — M5412 Radiculopathy, cervical region: Secondary | ICD-10-CM

## 2021-08-27 DIAGNOSIS — M4802 Spinal stenosis, cervical region: Secondary | ICD-10-CM | POA: Diagnosis not present

## 2021-08-27 DIAGNOSIS — R200 Anesthesia of skin: Secondary | ICD-10-CM | POA: Diagnosis not present

## 2021-08-27 DIAGNOSIS — M47812 Spondylosis without myelopathy or radiculopathy, cervical region: Secondary | ICD-10-CM | POA: Diagnosis not present

## 2021-08-27 DIAGNOSIS — R202 Paresthesia of skin: Secondary | ICD-10-CM | POA: Diagnosis not present

## 2021-08-27 MED ORDER — PREDNISONE 50 MG PO TABS: ORAL_TABLET | ORAL | 0 refills | Status: DC

## 2021-08-27 NOTE — Progress Notes (Signed)
    Procedures performed today:    None.  Independent interpretation of notes and tests performed by another provider:   None.  Brief History, Exam, Impression, and Recommendations:    Radiculitis of left cervical region This is a pleasant 55 year old female, she has a year-long history of pain in the neck, radiation down the left arm to the second and third fingers, sometimes the fourth, consistent with a left C7 radiculitis, exam is benign with normal reflexes and normal strength. We will start conservatively with 5 days of prednisone, updated x-rays, continue gabapentin 300 nightly. Adding formal physical therapy. Return to see me in 6 weeks, MRI for interventional planning if no better.  Chronic process with exacerbation and pharmacologic intervention  ___________________________________________ [name]. [name], M.D., ABFM., CAQSM. Primary Care and Sports Medicine [facility]  Adjunct Instructor of Family Medicine  [employer]

## 2021-08-27 NOTE — Assessment & Plan Note (Signed)
This is a pleasant 55 year old female, she has a year-long history of pain in the neck, radiation down the left arm to the second and third fingers, sometimes the fourth, consistent with a left C7 radiculitis, exam is benign with normal reflexes and normal strength. We will start conservatively with 5 days of prednisone, updated x-rays, continue gabapentin 300 nightly. Adding formal physical therapy. Return to see me in 6 weeks, MRI for interventional planning if no better.

## 2021-08-29 ENCOUNTER — Ambulatory Visit
Admission: RE | Admit: 2021-08-29 | Discharge: 2021-08-29 | Disposition: A | Discharge status: Home / Self Care | Payer: BC Managed Care – PPO | Source: Ambulatory Visit | Attending: Osteopathic Medicine | Admitting: Osteopathic Medicine

## 2021-08-29 DIAGNOSIS — R928 Other abnormal and inconclusive findings on diagnostic imaging of breast: Secondary | ICD-10-CM

## 2021-08-29 DIAGNOSIS — R922 Inconclusive mammogram: Secondary | ICD-10-CM | POA: Diagnosis not present

## 2021-09-04 ENCOUNTER — Encounter: Payer: Self-pay | Admitting: Physical Therapy

## 2021-09-04 ENCOUNTER — Other Ambulatory Visit: Payer: Self-pay

## 2021-09-04 ENCOUNTER — Ambulatory Visit (INDEPENDENT_AMBULATORY_CARE_PROVIDER_SITE_OTHER): Payer: BC Managed Care – PPO | Admitting: Physical Therapy

## 2021-09-04 DIAGNOSIS — M542 Cervicalgia: Secondary | ICD-10-CM | POA: Diagnosis not present

## 2021-09-04 DIAGNOSIS — R293 Abnormal posture: Secondary | ICD-10-CM | POA: Diagnosis not present

## 2021-09-04 DIAGNOSIS — M25522 Pain in left elbow: Secondary | ICD-10-CM | POA: Diagnosis not present

## 2021-09-04 DIAGNOSIS — M545 Low back pain, unspecified: Secondary | ICD-10-CM | POA: Diagnosis not present

## 2021-09-04 DIAGNOSIS — M79632 Pain in left forearm: Secondary | ICD-10-CM

## 2021-09-04 DIAGNOSIS — M5412 Radiculopathy, cervical region: Secondary | ICD-10-CM

## 2021-09-04 NOTE — Therapy (Signed)
[facility] [place] [address] [place], [place], [address] Phone: [phone]   Fax:  [phone]  Physical Therapy Evaluation  Patient Details  Name: Judith [name] MRN: [id number] Date of Birth: 1965-10-28 Referring Provider (PT): Monica Becton, MD   Encounter Date: 09/04/2021   PT End of Session - 09/04/21 1553     Visit Number 1    Number of Visits 12    Date for PT Re-Evaluation 10/16/21    Authorization Type BCBS    PT Start Time 1318    PT Stop Time 1405    PT Time Calculation (min) 47 min    Activity Tolerance Patient tolerated treatment well    Behavior During Therapy [facility] for tasks assessed/performed             Past Medical History:  Diagnosis Date   Anxiety    Colon polyps    Hypercholesteremia     History reviewed. No pertinent surgical history.  There were no vitals filed for this visit.    Subjective Assessment - 09/04/21 1319     Subjective "For a while now I've had problems with my left arm. One doctor said it was tennis elbow, another said golfer's elbow. It has gotten worse and worse. Ring finger and middle finger started to get numb." Reports pain in neck/upper back. Low back is a burning sensation. Since starting prednisone she felt that bilat knees started throbbing. Pt states her ring finger started to swell up. Flare up happened last week but it has been ongoing for ~1 year. Pt had tried different exercises and braces on her arm but with no improvements. Tries not to carry too much in fear of hurting herself further.    Limitations House hold activities    How long can you sit comfortably? n/a    How long can you stand comfortably? n/a    How long can you walk comfortably? n/a    Patient Stated Goals Improve comfort with sleeping    Currently in Pain? Yes    Pain Score 4    10/10 at worst   Pain Location Elbow    Pain Orientation Left    Pain Descriptors / Indicators  Tingling;Discomfort    Pain Type Chronic pain    Pain Radiating Towards forearm and into ring/middle fingers    Aggravating Factors  certain sleeping positions    Pain Relieving Factors keep arm propped up/positioning it right, tylenol, heat    Effect of Pain on Daily Activities difficulty sleeping    Multiple Pain Sites Yes    Pain Score 3   10/10 at worst at night   Pain Location Back    Pain Orientation Upper;Right    Pain Descriptors / Indicators Discomfort;Aching    Pain Type Chronic pain    Pain Frequency Intermittent    Aggravating Factors  sitting at her desk for prolonged periods                [facility] PT Assessment - 09/04/21 0001       Assessment   Medical Diagnosis M54.12 (ICD-10-CM) - Radiculitis of left cervical region    Referring Provider (PT) Monica Becton, MD    Onset Date/Surgical Date --   ~1 year   Hand Dominance Right    Prior Therapy Has done exercises given from ortho for golfer's elbow and tennis elbow      Precautions   Precautions None      Restrictions  Weight Bearing Restrictions No      Balance Screen   Has the patient fallen in the past 6 months No      Prior Function   Vocation Full time employment    Pension scheme manager job, work from home      Observation/Other Assessments   Focus on Therapeutic Outcomes (FOTO)  TBA      Sensation   Light Touch Appears Intact      Posture/Postural Control   Posture/Postural Control Postural limitations    Postural Limitations Decreased thoracic kyphosis;Rounded Shoulders      ROM / Strength   AROM / PROM / Strength Strength;AROM      AROM   Overall AROM Comments bilat wrist flexor and extensor tightness    AROM Assessment Site Cervical    Cervical Flexion WFL    Cervical Extension WFL    Cervical - Right Side Bend 25    Cervical - Left Side Bend 30    Cervical - Right Rotation 40    Cervical - Left Rotation 38      Strength   Strength Assessment Site Shoulder     Right/Left Shoulder Right;Left    Right Shoulder Flexion 4+/5    Right Shoulder ABduction 4+/5    Right Shoulder Internal Rotation 4/5    Right Shoulder External Rotation 4-/5    Left Shoulder Flexion 4+/5    Left Shoulder ABduction 4+/5    Left Shoulder Internal Rotation 4/5    Left Shoulder External Rotation 4/5      Palpation   Spinal mobility hypomobile mid to upper cervical spine    Palpation comment TTP bilat UT R worse than L; painful with L radius PA mob;      Special Tests    Special Tests Cervical    Other special tests Median nerve testing mildly symptomatic    Cervical Tests Spurling's;Dictraction      Spurling's   Findings Negative      Distraction Test   Findngs Negative                        Objective measurements completed on examination: See above findings.                PT Education - 09/04/21 1553     Education Details Discussed desk ergonomics, posture, exam findings and HEP    Person(s) Educated Patient    Methods Explanation;Demonstration;Tactile cues;Verbal cues;Handout    Comprehension Verbalized understanding;Returned demonstration;Verbal cues required;Tactile cues required;Need further instruction                 PT Long Term Goals - 09/04/21 1603       PT LONG TERM GOAL #1   Title Pt will be independent with HEP    Time 6    Period Weeks    Status New    Target Date 10/16/21      PT LONG TERM GOAL #2   Title Pt will have improved cervical ROM by at least 10 deg with lateral flexion and rotation    Time 6    Period Weeks    Status New    Target Date 10/16/21      PT LONG TERM GOAL #3   Title Pt will report decrease in N/T by at least 75% in her fingers    Time 6    Period Weeks    Status New    Target Date 10/16/21  PT LONG TERM GOAL #4   Title Pt will report decrease in her neck/upper back pain by at least 75%    Time 6    Period Weeks    Status New    Target Date 10/16/21      PT  LONG TERM GOAL #5   Title Pt will be able to lift and carry at least 20# of groceries without pain    Time 6    Period Weeks    Status New    Target Date 10/16/21                    Plan - 09/04/21 1555     Clinical Impression Statement Ms. Judith [name] is a 55 y/o F presenting to OPPT due to increased L elbow pain which has worsened to forearm and finger numbness with cervical tightness. On assessment, pt demos decreased cervical ROM, decrease wrist ROM bilat, TTP with L radius PA and decreased posture. Unable to fully recreate pt's N/T -- unsure if median or radial nerve entrapment in elbow/forearm with or without cervical radiculopathy. Pt would benefit from PT to address these issues to improve her posture and comfort.    Personal Factors and Comorbidities Time since onset of injury/illness/exacerbation;Fitness    Examination-Activity Limitations Sleep;Lift;Carry    Examination-Participation Restrictions Community Activity;Yard Work;Occupation    Stability/Clinical Decision Making Evolving/Moderate complexity    Clinical Decision Making Moderate    Rehab Potential Good    PT Frequency 2x / week    PT Duration 6 weeks    PT Treatment/Interventions ADLs/Self Care Home Management;Cryotherapy;Electrical Stimulation;Iontophoresis 4mg /ml Dexamethasone;Moist Heat;Ultrasound;Functional mobility training;Therapeutic activities;Therapeutic exercise;Neuromuscular re-education;Manual techniques;Patient/family education;Dry needling;Passive range of motion;Taping    PT Next Visit Plan Work on improving cervical ROM and postural alignment. Initiate strengthening posterior shoulder girdle, biceps, wrist, forearms. Re assess nerve gliding. Review sitting posture for work.    PT Home Exercise Plan Access Code: [email or web address]             Patient will benefit from skilled therapeutic intervention in order to improve the following deficits and impairments:  Decreased range of motion, Impaired UE  functional use, Pain, Decreased mobility, Decreased strength, Impaired sensation, Postural dysfunction, Improper body mechanics  Visit Diagnosis: Cervicalgia  Abnormal posture  Bilateral low back pain without sciatica, unspecified chronicity  Pain in left elbow  Pain in left forearm  Radiculopathy, cervical region     Problem List Patient Active Problem List   Diagnosis Date Noted   Radiculitis of left cervical region 08/27/2021   Primary insomnia 11/12/2020   Chronic pain of right knee 11/12/2020   Left lateral epicondylitis 12/28/2019   Chronic low back pain 11/16/2019   Leukocytosis 10/30/2019   Elevated hemoglobin (HCC) 10/30/2019   Tobacco abuse 06/13/2017   Atypical chest pain 05/30/2017   Gastroesophageal reflux disease without esophagitis 05/30/2017   Mixed hyperlipidemia 05/30/2017    [facility] April May, PT, DPT 09/04/2021, 4:09 PM  [facility] [address] [place], [place], [place] Phone: [phone]   Fax:  [phone]  Name: Judith [name] MRN: Berline Chough Date of Birth: October 15, 1966

## 2021-09-04 NOTE — Patient Instructions (Signed)
Access Code: [email or web address] URL: [email or web address] Date: 09/04/2021 Prepared by: Vernon Prey April Kirstie Peri  Exercises Seated Upper Trapezius Stretch - 3 x daily - 7 x weekly - 1 sets - 30 sec hold Seated Levator Scapulae Stretch - 1 x daily - 7 x weekly - 1 sets - 30 sec hold Seated Scapular Retraction - 1 x daily - 7 x weekly - 2 sets - 10 reps Median Nerve Mobilization - 1 x daily - 7 x weekly - 1 sets - 10 reps Standing Radial Nerve Glide - 1 x daily - 7 x weekly - 1 sets - 10 reps  Patient Education Office Posture

## 2021-09-07 ENCOUNTER — Other Ambulatory Visit: Payer: Self-pay | Admitting: Medical-Surgical

## 2021-09-09 ENCOUNTER — Other Ambulatory Visit: Payer: Self-pay

## 2021-09-09 ENCOUNTER — Ambulatory Visit (INDEPENDENT_AMBULATORY_CARE_PROVIDER_SITE_OTHER): Payer: BC Managed Care – PPO | Admitting: Rehabilitative and Restorative Service Providers"

## 2021-09-09 ENCOUNTER — Encounter: Payer: Self-pay | Admitting: Rehabilitative and Restorative Service Providers"

## 2021-09-09 DIAGNOSIS — M545 Low back pain, unspecified: Secondary | ICD-10-CM

## 2021-09-09 DIAGNOSIS — M25522 Pain in left elbow: Secondary | ICD-10-CM | POA: Diagnosis not present

## 2021-09-09 DIAGNOSIS — R293 Abnormal posture: Secondary | ICD-10-CM | POA: Diagnosis not present

## 2021-09-09 DIAGNOSIS — M79632 Pain in left forearm: Secondary | ICD-10-CM

## 2021-09-09 DIAGNOSIS — M542 Cervicalgia: Secondary | ICD-10-CM | POA: Diagnosis not present

## 2021-09-09 DIAGNOSIS — M5412 Radiculopathy, cervical region: Secondary | ICD-10-CM

## 2021-09-09 NOTE — Therapy (Signed)
[facility] [place] [address] [place], [place], [address] Phone: [phone]   Fax:  [phone]  Physical Therapy Treatment  Patient Details  Name: Judith [name] MRN: [id number] Date of Birth: February 07, 1966 Referring Provider (PT): Monica Becton, MD   Encounter Date: 09/09/2021   PT End of Session - 09/09/21 1434     Visit Number 2    Number of Visits 12    Date for PT Re-Evaluation 10/16/21    Authorization Type BCBS    PT Start Time 1430    PT Stop Time 1518    PT Time Calculation (min) 48 min    Activity Tolerance Patient tolerated treatment well             Past Medical History:  Diagnosis Date   Anxiety    Colon polyps    Hypercholesteremia     History reviewed. No pertinent surgical history.  There were no vitals filed for this visit.   Subjective Assessment - 09/09/21 1512     Subjective Working on exercises. Still can't sleep.    Currently in Pain? Yes    Pain Score 1     Pain Location Elbow    Pain Orientation Left    Pain Descriptors / Indicators Tingling;Discomfort    Pain Type Chronic pain                               [facility] Adult PT Treatment/Exercise - 09/09/21 0001       Self-Care   Self-Care Other Self-Care Comments    Other Self-Care Comments  suggestions for positions for sleep/use of towel in cervical spine and pillow to support Lt UE      Neuro Re-ed    Neuro Re-ed Details  postural correction in sitting and standing engaging posterior shoulder girdle and bringing cervical spine and head into more neutral position      Shoulder Exercises: Supine   Other Supine Exercises axial extension 10 sec x 5 reps      Shoulder Exercises: Seated   Retraction AROM;Strengthening;Both;5 reps    Retraction Limitations working on posture and posterior shoulder girdle activation    Other Seated Exercises upper trap stretch with good cervical alignment 30 sec x 2 reps     Other Seated Exercises leveator stretch 30 sec x 2 reps      Shoulder Exercises: Standing   Other Standing Exercises scap squeeze 10 sec x 10; L's x 10; W's x 10 with swim noodle along spine      Shoulder Exercises: Stretch   Other Shoulder Stretches doorway stretch 3 positions 20-30 sec x 2 reps each position - notes tingling at 10-15 sec resolves as she releases stretch      Manual Therapy   Manual therapy comments pt supine    Joint Mobilization cervical PA and lateral mobs Grade II    Soft tissue mobilization STM Lt > Rt cervical musculature into pecs and upper trap    Myofascial Release anterior chest    Manual Traction cervical traction 10-15 sec hold x 4 reps during treatment                     PT Education - 09/09/21 1453     Education Details HEP; DN    Person(s) Educated Patient    Methods Explanation;Demonstration;Tactile cues;Verbal cues;Handout    Comprehension Verbalized understanding;Returned demonstration;Verbal cues required;Tactile cues required  PT Long Term Goals - 09/04/21 1603       PT LONG TERM GOAL #1   Title Pt will be independent with HEP    Time 6    Period Weeks    Status New    Target Date 10/16/21      PT LONG TERM GOAL #2   Title Pt will have improved cervical ROM by at least 10 deg with lateral flexion and rotation    Time 6    Period Weeks    Status New    Target Date 10/16/21      PT LONG TERM GOAL #3   Title Pt will report decrease in N/T by at least 75% in her fingers    Time 6    Period Weeks    Status New    Target Date 10/16/21      PT LONG TERM GOAL #4   Title Pt will report decrease in her neck/upper back pain by at least 75%    Time 6    Period Weeks    Status New    Target Date 10/16/21      PT LONG TERM GOAL #5   Title Pt will be able to lift and carry at least 20# of groceries without pain    Time 6    Period Weeks    Status New    Target Date 10/16/21                    Plan - 09/09/21 1438     Clinical Impression Statement Judith [name] reports some tingling with nerve stretch and cervical stretches but tingling resloves when she stops the stretch. She continues to have greatest difficulty with sleeping at night and is unable to get into a comfortable position, experiencing increased Lt forearm pain at nigh. Patient has poor cervical and shoulder girdle posture and alignment; significant muscular tightness in the pecs, upper trap, cervical musculature with greatest tightness in the cervical spine at C 3/4/5 on Lt. Added postural correction exercises, initiated posterior shoulder girdle strengthening; added trial of manual work; instructed in use of swim noodle for sitting and offered suggestions for sleeping positions as well as use of TENS for home(has TENS unit).    Rehab Potential Good    PT Frequency 2x / week    PT Duration 6 weeks    PT Treatment/Interventions ADLs/Self Care Home Management;Cryotherapy;Electrical Stimulation;Iontophoresis 4mg /ml Dexamethasone;Moist Heat;Ultrasound;Functional mobility training;Therapeutic activities;Therapeutic exercise;Neuromuscular re-education;Manual techniques;Patient/family education;Dry needling;Passive range of motion;Taping    PT Next Visit Plan Work on improving cervical ROM and postural alignment. Initiate strengthening posterior shoulder girdle, biceps, wrist, forearms. Continue nerve mobilization/gliding; postural correction; manual work as tolerated    PT Home Exercise Plan [email or web address]    Consulted and Agree with Plan of Care Patient             Patient will benefit from skilled therapeutic intervention in order to improve the following deficits and impairments:     Visit Diagnosis: Cervicalgia  Abnormal posture  Bilateral low back pain without sciatica, unspecified chronicity  Pain in left elbow  Pain in left forearm  Radiculopathy, cervical region     Problem List Patient Active Problem  List   Diagnosis Date Noted   Radiculitis of left cervical region 08/27/2021   Primary insomnia 11/12/2020   Chronic pain of right knee 11/12/2020   Left lateral epicondylitis 12/28/2019   Chronic low back pain 11/16/2019   Leukocytosis 10/30/2019   Elevated hemoglobin (  HCC) 10/30/2019   Tobacco abuse 06/13/2017   Atypical chest pain 05/30/2017   Gastroesophageal reflux disease without esophagitis 05/30/2017   Mixed hyperlipidemia 05/30/2017    Annalynne Ibanez Rober Minion, PT, MPH  09/09/2021, 5:05 PM  [facility] [address] [place], [place], [address] Phone: [phone]   Fax:  [phone]  Name: Judith [name] MRN: [id number] Date of Birth: 14-Oct-1966

## 2021-09-09 NOTE — Patient Instructions (Addendum)
  Access Code: [email or web address] URL: [email or web address] Date: 09/09/2021 Prepared by: Corlis Leak  Exercises Seated Upper Trapezius Stretch - 3 x daily - 7 x weekly - 1 sets - 30 sec hold Seated Levator Scapulae Stretch - 1 x daily - 7 x weekly - 1 sets - 30 sec hold Seated Scapular Retraction - 1 x daily - 7 x weekly - 2 sets - 10 reps Median Nerve Mobilization - 1 x daily - 7 x weekly - 1 sets - 10 reps Standing Radial Nerve Glide - 1 x daily - 7 x weekly - 1 sets - 10 reps Shoulder External Rotation and Scapular Retraction - 2 x daily - 7 x weekly - 1 sets - 10 reps - 3-5 sec hold Shoulder External Rotation in 45 Degrees Abduction - 2 x daily - 7 x weekly - 1-2 sets - 10 reps - 3-5 sec hold Doorway Pec Stretch at 60 Degrees Abduction - 3 x daily - 7 x weekly - 1 sets - 3 reps Doorway Pec Stretch at 90 Degrees Abduction - 3 x daily - 7 x weekly - 1 sets - 3 reps - 30 seconds hold Doorway Pec Stretch at 120 Degrees Abduction - 3 x daily - 7 x weekly - 1 sets - 3 reps - 30 second hold hold Trigger Point Dry Needling  What is Trigger Point Dry Needling (DN)? DN is a physical therapy technique used to treat muscle pain and dysfunction. Specifically, DN helps deactivate muscle trigger points (muscle knots).  A thin filiform needle is used to penetrate the skin and stimulate the underlying trigger point. The goal is for a local twitch response (LTR) to occur and for the trigger point to relax. No medication of any kind is injected during the procedure.   What Does Trigger Point Dry Needling Feel Like?  The procedure feels different for each individual patient. Some patients report that they do not actually feel the needle enter the skin and overall the process is not painful. Very mild bleeding may occur. However, many patients feel a deep cramping in the muscle in which the needle was inserted. This is the local twitch response.   How Will I feel after the treatment? Soreness is  normal, and the onset of soreness may not occur for a few hours. Typically this soreness does not last longer than two days.  Bruising is uncommon, however; ice can be used to decrease any possible bruising.  In rare cases feeling tired or nauseous after the treatment is normal. In addition, your symptoms may get worse before they get better, this period will typically not last longer than 24 hours.   What Can I do After My Treatment? Increase your hydration by drinking more water for the next 24 hours. You may place ice or heat on the areas treated that have become sore, however, do not use heat on inflamed or bruised areas. Heat often brings more relief post needling. You can continue your regular activities, but vigorous activity is not recommended initially after the treatment for 24 hours. DN is best combined with other physical therapy such as strengthening, stretching, and other therapies.

## 2021-09-10 ENCOUNTER — Encounter: Payer: BC Managed Care – PPO | Admitting: Rehabilitative and Restorative Service Providers"

## 2021-09-11 ENCOUNTER — Encounter: Payer: BC Managed Care – PPO | Admitting: Physical Therapy

## 2021-09-15 ENCOUNTER — Other Ambulatory Visit: Payer: Self-pay

## 2021-09-15 ENCOUNTER — Encounter: Payer: Self-pay | Admitting: Rehabilitative and Restorative Service Providers"

## 2021-09-15 ENCOUNTER — Ambulatory Visit (INDEPENDENT_AMBULATORY_CARE_PROVIDER_SITE_OTHER): Payer: BC Managed Care – PPO | Admitting: Rehabilitative and Restorative Service Providers"

## 2021-09-15 DIAGNOSIS — R293 Abnormal posture: Secondary | ICD-10-CM

## 2021-09-15 DIAGNOSIS — M25522 Pain in left elbow: Secondary | ICD-10-CM

## 2021-09-15 DIAGNOSIS — M545 Low back pain, unspecified: Secondary | ICD-10-CM

## 2021-09-15 DIAGNOSIS — M542 Cervicalgia: Secondary | ICD-10-CM | POA: Diagnosis not present

## 2021-09-15 DIAGNOSIS — M5412 Radiculopathy, cervical region: Secondary | ICD-10-CM

## 2021-09-15 DIAGNOSIS — M79632 Pain in left forearm: Secondary | ICD-10-CM

## 2021-09-15 NOTE — Therapy (Signed)
[facility] [place] [address] [place], [place], [address] Phone: [phone]   Fax:  [phone]  Physical Therapy Treatment  Patient Details  Name: Judith [name] MRN: [id number] Date of Birth: 1965/11/02 Referring Provider (PT): Monica Becton, MD   Encounter Date: 09/15/2021   PT End of Session - 09/15/21 0849     Visit Number 3    Number of Visits 12    Date for PT Re-Evaluation 10/16/21    Authorization Type BC[employer]    PT Start Time [phone]    PT Stop Time 0930    PT Time Calculation (min) 43 min    Activity Tolerance Patient tolerated treatment well             Past Medical History:  Diagnosis Date   Anxiety    Colon polyps    Hypercholesteremia     History reviewed. No pertinent surgical history.  There were no vitals filed for this visit.   Subjective Assessment - 09/15/21 0852     Subjective Some improvement. Still has issues but not as bad as it was. Still having pain in the Lt elbow, neck and shoulders. LBP is always there - and has been for years.    Currently in Pain? Yes    Pain Score 2     Pain Location Elbow    Pain Orientation Left    Pain Descriptors / Indicators Throbbing    Pain Type Chronic pain    Pain Radiating Towards less in forearm and ring/fingers and does not make the fingers "go numb"    Pain Onset More than a month ago    Pain Score 3   worse at night and after work up to 5/10   Pain Location Back    Pain Orientation Upper;Right    Pain Descriptors / Indicators Aching;Discomfort    Pain Type Chronic pain                [facility] PT Assessment - 09/15/21 0001       Assessment   Medical Diagnosis M54.12 (ICD-10-CM) - Radiculitis of left cervical region    Referring Provider (PT) Monica Becton, MD    Onset Date/Surgical Date --   ~1 year   Hand Dominance Right    Prior Therapy Has done exercises given from ortho for golfer's elbow and tennis elbow       Palpation   Spinal mobility hypomobile mid to upper cervical spine    Palpation comment muscular tightness cervical greatest C3/4/5; pecs; upper trap; leveator                           [facility] Adult PT Treatment/Exercise - 09/15/21 0001       Shoulder Exercises: Supine   Other Supine Exercises axial extension 10 sec x 5 reps      Shoulder Exercises: Seated   Retraction AROM;Strengthening;Both;5 reps    Retraction Limitations working on posture and posterior shoulder girdle activation      Shoulder Exercises: Standing   Retraction Strengthening;Both;10 reps;Theraband    Theraband Level (Shoulder Retraction) Level 1 (Yellow)    Retraction Limitations working on postrure and alignment engaging posterior shoulder girdle    Other Standing Exercises scap squeeze 10 sec x 10; L's x 10; W's x 10 with swim noodle along spine    Other Standing Exercises W with TB x 10 reps yellow TB      Shoulder Exercises:  Stretch   Other Shoulder Stretches doorway stretch 3 positions 20-30 sec x 2 reps each position - notes less tingling at 10-15 sec resolves as she releases stretch      Manual Therapy   Manual therapy comments pt supine    Joint Mobilization cervical PA and lateral mobs Grade II    Soft tissue mobilization STM Lt > Rt cervical musculature into pecs and upper trap    Myofascial Release anterior chest    Manual Traction cervical traction 10-15 sec hold x 4 reps during treatment                     PT Education - 09/15/21 0906     Education Details HEP    Person(s) Educated Patient    Methods Explanation;Demonstration;Tactile cues;Verbal cues;Handout    Comprehension Verbalized understanding;Returned demonstration;Verbal cues required;Tactile cues required                 PT Long Term Goals - 09/04/21 1603       PT LONG TERM GOAL #1   Title Pt will be independent with HEP    Time 6    Period Weeks    Status New    Target Date 10/16/21       PT LONG TERM GOAL #2   Title Pt will have improved cervical ROM by at least 10 deg with lateral flexion and rotation    Time 6    Period Weeks    Status New    Target Date 10/16/21      PT LONG TERM GOAL #3   Title Pt will report decrease in N/T by at least 75% in her fingers    Time 6    Period Weeks    Status New    Target Date 10/16/21      PT LONG TERM GOAL #4   Title Pt will report decrease in her neck/upper back pain by at least 75%    Time 6    Period Weeks    Status New    Target Date 10/16/21      PT LONG TERM GOAL #5   Title Pt will be able to lift and carry at least 20# of groceries without pain    Time 6    Period Weeks    Status New    Target Date 10/16/21                   Plan - 09/15/21 0858     Clinical Impression Statement Some improvement with decreased intensity of pain and improved exercise tolerance. Working on exercises. Still has increased back pain at night as well increased pain in the forearm. Continued tightness in  thorugh the pecs, upper trap, cervical musculature with greatest tightness in the cervical spine at C3/4/5 on Lt.    Rehab Potential Good    PT Frequency 2x / week    PT Duration 6 weeks    PT Treatment/Interventions ADLs/Self Care Home Management;Cryotherapy;Electrical Stimulation;Iontophoresis 4mg /ml Dexamethasone;Moist Heat;Ultrasound;Functional mobility training;Therapeutic activities;Therapeutic exercise;Neuromuscular re-education;Manual techniques;Patient/family education;Dry needling;Passive range of motion;Taping    PT Next Visit Plan Work on improving cervical ROM and postural alignment. Initiate strengthening posterior shoulder girdle, biceps, wrist, forearms. Continue nerve mobilization/gliding; postural correction; manual work as tolerated    PT Home Exercise Plan [email or web address]    Consulted and Agree with Plan of Care Patient             Patient will benefit from skilled  therapeutic intervention in order to improve  the following deficits and impairments:     Visit Diagnosis: Cervicalgia  Abnormal posture  Bilateral low back pain without sciatica, unspecified chronicity  Pain in left elbow  Pain in left forearm  Radiculopathy, cervical region     Problem List Patient Active Problem List   Diagnosis Date Noted   Radiculitis of left cervical region 08/27/2021   Primary insomnia 11/12/2020   Chronic pain of right knee 11/12/2020   Left lateral epicondylitis 12/28/2019   Chronic low back pain 11/16/2019   Leukocytosis 10/30/2019   Elevated hemoglobin (HCC) 10/30/2019   Tobacco abuse 06/13/2017   Atypical chest pain 05/30/2017   Gastroesophageal reflux disease without esophagitis 05/30/2017   Mixed hyperlipidemia 05/30/2017    Nikoloz Huy Rober Minion, PT, MPH  09/15/2021, 9:30 AM  [facility] [address] [place], [place], [address] Phone: [phone]   Fax:  [phone]  Name: Judith [name] MRN: [id number] Date of Birth: August 24, 1966

## 2021-09-15 NOTE — Patient Instructions (Signed)
Access Code: [email or web address] URL: [email or web address] Date: 09/15/2021 Prepared by: Corlis Leak  Exercises Seated Upper Trapezius Stretch - 3 x daily - 7 x weekly - 1 sets - 30 sec hold Seated Levator Scapulae Stretch - 1 x daily - 7 x weekly - 1 sets - 30 sec hold Seated Scapular Retraction - 1 x daily - 7 x weekly - 2 sets - 10 reps Median Nerve Mobilization - 1 x daily - 7 x weekly - 1 sets - 10 reps Standing Radial Nerve Glide - 1 x daily - 7 x weekly - 1 sets - 10 reps Shoulder External Rotation and Scapular Retraction - 2 x daily - 7 x weekly - 1 sets - 10 reps - 3-5 sec hold Shoulder External Rotation in 45 Degrees Abduction - 2 x daily - 7 x weekly - 1-2 sets - 10 reps - 3-5 sec hold Doorway Pec Stretch at 60 Degrees Abduction - 3 x daily - 7 x weekly - 1 sets - 3 reps Doorway Pec Stretch at 90 Degrees Abduction - 3 x daily - 7 x weekly - 1 sets - 3 reps - 30 seconds hold Doorway Pec Stretch at 120 Degrees Abduction - 3 x daily - 7 x weekly - 1 sets - 3 reps - 30 second hold hold Shoulder External Rotation and Scapular Retraction with Resistance - 2 x daily - 7 x weekly - 1-3 sets - 10 reps - 3-5 sec hold Shoulder W - External Rotation with Resistance - 2 x daily - 7 x weekly - 1-3 sets - 10 reps - 3 sec hold

## 2021-09-22 ENCOUNTER — Ambulatory Visit (INDEPENDENT_AMBULATORY_CARE_PROVIDER_SITE_OTHER): Payer: BC Managed Care – PPO | Admitting: Rehabilitative and Restorative Service Providers"

## 2021-09-22 ENCOUNTER — Encounter: Payer: Self-pay | Admitting: Rehabilitative and Restorative Service Providers"

## 2021-09-22 ENCOUNTER — Other Ambulatory Visit: Payer: Self-pay

## 2021-09-22 DIAGNOSIS — M542 Cervicalgia: Secondary | ICD-10-CM

## 2021-09-22 DIAGNOSIS — M25522 Pain in left elbow: Secondary | ICD-10-CM

## 2021-09-22 DIAGNOSIS — R293 Abnormal posture: Secondary | ICD-10-CM

## 2021-09-22 DIAGNOSIS — M545 Low back pain, unspecified: Secondary | ICD-10-CM | POA: Diagnosis not present

## 2021-09-22 DIAGNOSIS — M5412 Radiculopathy, cervical region: Secondary | ICD-10-CM

## 2021-09-22 DIAGNOSIS — M79632 Pain in left forearm: Secondary | ICD-10-CM

## 2021-09-22 NOTE — Therapy (Signed)
[facility] [place] [address] [place], [place], [address] Phone: [phone]   Fax:  [phone]  Physical Therapy Treatment  Patient Details  Name: Judith [name] MRN: [id number] Date of Birth: [birth year] Referring Provider (PT): Monica Becton, MD   Encounter Date: 09/22/2021   PT End of Session - 09/22/21 0933     Visit Number 4    Number of Visits 12    Date for PT Re-Evaluation 10/16/21    PT Start Time 0932    PT Stop Time 1025    PT Time Calculation (min) 53 min    Activity Tolerance Patient tolerated treatment well             Past Medical History:  Diagnosis Date   Anxiety    Colon polyps    Hypercholesteremia     History reviewed. No pertinent surgical history.  There were no vitals filed for this visit.   Subjective Assessment - 09/22/21 0939     Subjective Working on her exercises. Still not sleeping well. Arm is hurting a lot at night. Unable to get in a comfortable position to sleep. Pain continues in the Lt elbow, neck and shoulders at about the same level. No worse.    Currently in Pain? Yes    Pain Score 2     Pain Location Elbow    Pain Orientation Left    Pain Descriptors / Indicators Throbbing    Pain Type Chronic pain    Pain Radiating Towards fingers no longer tingling and numb at night    Pain Score 3    Pain Location Back    Pain Orientation Upper;Right    Pain Descriptors / Indicators Aching;Discomfort    Pain Type Chronic pain                [facility] PT Assessment - 09/22/21 0001       Assessment   Medical Diagnosis M54.12 (ICD-10-CM) - Radiculitis of left cervical region    Referring Provider (PT) Monica Becton, MD    Onset Date/Surgical Date --   ~1 year   Hand Dominance Right    Prior Therapy Has done exercises given from ortho for golfer's elbow and tennis elbow      Prior Function   Vocation Full time employment    Vocation Requirements Desk job,  work from home      AROM   Cervical Flexion 33    Cervical Extension 26    Cervical - Right Side Bend 26    Cervical - Left Side Bend 25    Cervical - Right Rotation 48    Cervical - Left Rotation 38                           [facility] Adult PT Treatment/Exercise - 09/22/21 0001       Shoulder Exercises: Supine   Other Supine Exercises axial extension 10 sec x 5 reps      Shoulder Exercises: Seated   Retraction AROM;Strengthening;Both;5 reps    Retraction Limitations working on posture and posterior shoulder girdle activation    Other Seated Exercises upper trap stretch with good cervical alignment 30 sec x 2 reps    Other Seated Exercises leveator stretch 30 sec x 2 reps      Shoulder Exercises: Prone   Retraction Strengthening;Both;5 reps    Retraction Limitations with axial extension      Shoulder Exercises: Standing  Retraction Strengthening;Both;10 reps;Theraband    Theraband Level (Shoulder Retraction) Level 1 (Yellow)    Retraction Limitations working on postrure and alignment engaging posterior shoulder girdle    Other Standing Exercises scap squeeze 10 sec x 10; L's x 10; W's x 10 with swim noodle along spine    Other Standing Exercises W with TB x 10 reps yellow TB      Shoulder Exercises: Stretch   Other Shoulder Stretches doorway stretch 3 positions 20-30 sec x 2 reps each position - notes less tingling at 10-15 sec resolves as she releases stretch      Moist Heat Therapy   Number Minutes Moist Heat 10 Minutes    Moist Heat Location Cervical;Shoulder      Manual Therapy   Manual Therapy --   pt supine for manual work prone for DN and manual work   Manual therapy comments skilled palpation to assess response to DN and manual work    Joint Mobilization cervical PA and lateral mobs Grade II    Soft tissue mobilization STM Lt > Rt cervical musculature into pecs and upper trap    Myofascial Release anterior chest              Trigger Point  Dry Needling - 09/22/21 0001     Consent Given? Yes    Education Handout Provided Yes    Muscles Treated Head and Neck Oblique capitus;Suboccipitals;Cervical multifidi    Oblique Capitus Response Palpable increased muscle length    Suboccipitals Response Palpable increased muscle length    Cervical multifidi Response Palpable increased muscle length                   PT Education - 09/22/21 1030     Education Details HEP DN    Person(s) Educated Patient    Methods Explanation;Demonstration;Tactile cues;Verbal cues;Handout    Comprehension Verbalized understanding;Returned demonstration;Verbal cues required;Tactile cues required                 PT Long Term Goals - 09/04/21 1603       PT LONG TERM GOAL #1   Title Pt will be independent with HEP    Time 6    Period Weeks    Status New    Target Date 10/16/21      PT LONG TERM GOAL #2   Title Pt will have improved cervical ROM by at least 10 deg with lateral flexion and rotation    Time 6    Period Weeks    Status New    Target Date 10/16/21      PT LONG TERM GOAL #3   Title Pt will report decrease in N/T by at least 75% in her fingers    Time 6    Period Weeks    Status New    Target Date 10/16/21      PT LONG TERM GOAL #4   Title Pt will report decrease in her neck/upper back pain by at least 75%    Time 6    Period Weeks    Status New    Target Date 10/16/21      PT LONG TERM GOAL #5   Title Pt will be able to lift and carry at least 20# of groceries without pain    Time 6    Period Weeks    Status New    Target Date 10/16/21  Plan - 09/22/21 0942     Clinical Impression Statement Numbness in Lt hand/fingers is improved. Pain continues at night more so than through the day. Suggested patient try stretching just before bed and try towel around neck to improve spinal alignment in sidelying. Patient demonstrated continued limitations in cervical ROM. Muscular  tightness in Lt > Rt pecs; upper trap; ant/lat/post cervical musculature greatest tightness in the Lt C3/4/5. Trial of DN cervical and occipital musculature. Note greater tightness Lt compared to Rt. Good response to DN and manual work.    Rehab Potential Good    PT Frequency 2x / week    PT Duration 6 weeks    PT Treatment/Interventions ADLs/Self Care Home Management;Cryotherapy;Electrical Stimulation;Iontophoresis 4mg /ml Dexamethasone;Moist Heat;Ultrasound;Functional mobility training;Therapeutic activities;Therapeutic exercise;Neuromuscular re-education;Manual techniques;Patient/family education;Dry needling;Passive range of motion;Taping    PT Next Visit Plan Work on improving cervical ROM and postural alignment. Initiate strengthening posterior shoulder girdle, biceps, wrist, forearms. Continue nerve mobilization/gliding; postural correction; manual work as tolerated    PT Home Exercise Plan [email or web address]    Consulted and Agree with Plan of Care Patient             Patient will benefit from skilled therapeutic intervention in order to improve the following deficits and impairments:     Visit Diagnosis: Cervicalgia  Abnormal posture  Bilateral low back pain without sciatica, unspecified chronicity  Pain in left elbow  Pain in left forearm  Radiculopathy, cervical region     Problem List Patient Active Problem List   Diagnosis Date Noted   Radiculitis of left cervical region 08/27/2021   Primary insomnia 11/12/2020   Chronic pain of right knee 11/12/2020   Left lateral epicondylitis 12/28/2019   Chronic low back pain 11/16/2019   Leukocytosis 10/30/2019   Elevated hemoglobin (HCC) 10/30/2019   Tobacco abuse 06/13/2017   Atypical chest pain 05/30/2017   Gastroesophageal reflux disease without esophagitis 05/30/2017   Mixed hyperlipidemia 05/30/2017    Panhia Karl 07/30/2017, PT, MPH  09/22/2021, 10:32 AM  [facility] [address] [place], [place], [place] Phone: [phone]   Fax:  [phone]  Name: Judith [name] MRN: Berline Chough Date of Birth: 19-Feb-1966

## 2021-09-22 NOTE — Patient Instructions (Addendum)
Trigger Point Dry Needling  What is Trigger Point Dry Needling (DN)? DN is a physical therapy technique used to treat muscle pain and dysfunction. Specifically, DN helps deactivate muscle trigger points (muscle knots).  A thin filiform needle is used to penetrate the skin and stimulate the underlying trigger point. The goal is for a local twitch response (LTR) to occur and for the trigger point to relax. No medication of any kind is injected during the procedure.   What Does Trigger Point Dry Needling Feel Like?  The procedure feels different for each individual patient. Some patients report that they do not actually feel the needle enter the skin and overall the process is not painful. Very mild bleeding may occur. However, many patients feel a deep cramping in the muscle in which the needle was inserted. This is the local twitch response.   How Will I feel after the treatment? Soreness is normal, and the onset of soreness may not occur for a few hours. Typically this soreness does not last longer than two days.  Bruising is uncommon, however; ice can be used to decrease any possible bruising.  In rare cases feeling tired or nauseous after the treatment is normal. In addition, your symptoms may get worse before they get better, this period will typically not last longer than 24 hours.   What Can I do After My Treatment? Increase your hydration by drinking more water for the next 24 hours. You may place ice or heat on the areas treated that have become sore, however, do not use heat on inflamed or bruised areas. Heat often brings more relief post needling. You can continue your regular activities, but vigorous activity is not recommended initially after the treatment for 24 hours. DN is best combined with other physical therapy such as strengthening, stretching, and other therapies.  Access Code: [email or web address] URL: [email or web address] Date: 09/22/2021 Prepared by: Corlis Leak  Exercises Seated Upper Trapezius Stretch - 3 x daily - 7 x weekly - 1 sets - 30 sec hold Seated Levator Scapulae Stretch - 1 x daily - 7 x weekly - 1 sets - 30 sec hold Seated Scapular Retraction - 1 x daily - 7 x weekly - 2 sets - 10 reps Median Nerve Mobilization - 1 x daily - 7 x weekly - 1 sets - 10 reps Standing Radial Nerve Glide - 1 x daily - 7 x weekly - 1 sets - 10 reps Shoulder External Rotation and Scapular Retraction - 2 x daily - 7 x weekly - 1 sets - 10 reps - 3-5 sec hold Shoulder External Rotation in 45 Degrees Abduction - 2 x daily - 7 x weekly - 1-2 sets - 10 reps - 3-5 sec hold Doorway Pec Stretch at 60 Degrees Abduction - 3 x daily - 7 x weekly - 1 sets - 3 reps Doorway Pec Stretch at 90 Degrees Abduction - 3 x daily - 7 x weekly - 1 sets - 3 reps - 30 seconds hold Doorway Pec Stretch at 120 Degrees Abduction - 3 x daily - 7 x weekly - 1 sets - 3 reps - 30 second hold hold Shoulder External Rotation and Scapular Retraction with Resistance - 2 x daily - 7 x weekly - 1-3 sets - 10 reps - 3-5 sec hold Shoulder W - External Rotation with Resistance - 2 x daily - 7 x weekly - 1-3 sets - 10 reps - 3 sec hold Prone Scapular Retraction - 2 x daily - 7  x weekly - 1 sets - 5-10 reps - 3-5 sec hold

## 2021-09-25 ENCOUNTER — Ambulatory Visit (INDEPENDENT_AMBULATORY_CARE_PROVIDER_SITE_OTHER): Payer: BC Managed Care – PPO | Admitting: Physical Therapy

## 2021-09-25 ENCOUNTER — Other Ambulatory Visit: Payer: Self-pay

## 2021-09-25 DIAGNOSIS — M5412 Radiculopathy, cervical region: Secondary | ICD-10-CM

## 2021-09-25 DIAGNOSIS — M25522 Pain in left elbow: Secondary | ICD-10-CM

## 2021-09-25 DIAGNOSIS — R293 Abnormal posture: Secondary | ICD-10-CM | POA: Diagnosis not present

## 2021-09-25 DIAGNOSIS — M542 Cervicalgia: Secondary | ICD-10-CM | POA: Diagnosis not present

## 2021-09-25 DIAGNOSIS — M545 Low back pain, unspecified: Secondary | ICD-10-CM

## 2021-09-25 DIAGNOSIS — M79632 Pain in left forearm: Secondary | ICD-10-CM

## 2021-09-25 NOTE — Therapy (Signed)
[facility] [place] [address] [place], [place], [address] Phone: [phone]   Fax:  [phone]  Physical Therapy Treatment  Patient Details  Name: Judith [name] MRN: [id number] Date of Birth: [birth year] Referring Provider (PT): Monica Becton, MD   Encounter Date: 09/25/2021   PT End of Session - 09/25/21 1309     Visit Number 5    Number of Visits 12    Date for PT Re-Evaluation 10/16/21    Authorization Type BCBS    PT Start Time 1310    PT Stop Time 1355    PT Time Calculation (min) 45 min    Activity Tolerance Patient tolerated treatment well    Behavior During Therapy [facility] for tasks assessed/performed             Past Medical History:  Diagnosis Date   Anxiety    Colon polyps    Hypercholesteremia     No past surgical history on file.  There were no vitals filed for this visit.   Subjective Assessment - 09/25/21 1312     Subjective "I think overall everything is improving. Night time it is still problematic but it has improved with rolling towels." Deferred dry needling today since she is going out of town to [employer].    Currently in Pain? Yes    Pain Score 2     Pain Location Elbow    Pain Orientation Left    Pain Descriptors / Indicators Throbbing    Pain Type Chronic pain                               [facility] Adult PT Treatment/Exercise - 09/25/21 0001       Shoulder Exercises: Supine   Other Supine Exercises Deep neck flexor iso holds 2x5x5 sec holds;    Other Supine Exercises chin tuck 10x3 sec      Shoulder Exercises: Seated   Other Seated Exercises upper trap stretch with good cervical alignment 30 sec x 2 reps    Other Seated Exercises leveator stretch 30 sec x 2 reps      Shoulder Exercises: ROM/Strengthening   UBE (Upper Arm Bike) L3 x 2 min forward, 2 min backward   focus on maintaining posture     Shoulder Exercises: Stretch   Other Shoulder  Stretches doorway stretch 3 positions 20-30 sec x 2 reps each position - notes less tingling at 10-15 sec resolves as she releases stretch    Other Shoulder Stretches neck flexion x30 sec      Modalities   Modalities Traction      Traction   Type of Traction Cervical    Min (lbs) 0    Max (lbs) 10    Hold Time 60    Rest Time 20    Time 10      Manual Therapy   Joint Mobilization cervical PA and lateral mobs Grade II to III    Soft tissue mobilization STM Lt > Rt cervical musculature                          PT Long Term Goals - 09/04/21 1603       PT LONG TERM GOAL #1   Title Pt will be independent with HEP    Time 6    Period Weeks    Status New    Target Date  10/16/21      PT LONG TERM GOAL #2   Title Pt will have improved cervical ROM by at least 10 deg with lateral flexion and rotation    Time 6    Period Weeks    Status New    Target Date 10/16/21      PT LONG TERM GOAL #3   Title Pt will report decrease in N/T by at least 75% in her fingers    Time 6    Period Weeks    Status New    Target Date 10/16/21      PT LONG TERM GOAL #4   Title Pt will report decrease in her neck/upper back pain by at least 75%    Time 6    Period Weeks    Status New    Target Date 10/16/21      PT LONG TERM GOAL #5   Title Pt will be able to lift and carry at least 20# of groceries without pain    Time 6    Period Weeks    Status New    Target Date 10/16/21                   Plan - 09/25/21 1352     Clinical Impression Statement Pt reports overall improvement. Continued to work on neck ROM/stretching and stabilization. Trialed traction this session -- pt had deferred TPDN this session. Pt continues to make small gains with motion.    Personal Factors and Comorbidities Time since onset of injury/illness/exacerbation;Fitness    Examination-Activity Limitations Sleep;Lift;Carry    Examination-Participation Restrictions Community Activity;Yard  Work;Occupation    Rehab Potential Good    PT Frequency 2x / week    PT Duration 6 weeks    PT Treatment/Interventions ADLs/Self Care Home Management;Cryotherapy;Electrical Stimulation;Iontophoresis 4mg /ml Dexamethasone;Moist Heat;Ultrasound;Functional mobility training;Therapeutic activities;Therapeutic exercise;Neuromuscular re-education;Manual techniques;Patient/family education;Dry needling;Passive range of motion;Taping    PT Next Visit Plan Work on improving cervical ROM and postural alignment. Initiate strengthening posterior shoulder girdle, biceps, wrist, forearms. Continue nerve mobilization/gliding; postural correction; manual work as tolerated    PT Home Exercise Plan [email or web address]    Consulted and Agree with Plan of Care Patient             Patient will benefit from skilled therapeutic intervention in order to improve the following deficits and impairments:  Decreased range of motion, Impaired UE functional use, Pain, Decreased mobility, Decreased strength, Impaired sensation, Postural dysfunction, Improper body mechanics  Visit Diagnosis: Cervicalgia  Abnormal posture  Bilateral low back pain without sciatica, unspecified chronicity  Pain in left elbow  Pain in left forearm  Radiculopathy, cervical region     Problem List Patient Active Problem List   Diagnosis Date Noted   Radiculitis of left cervical region 08/27/2021   Primary insomnia 11/12/2020   Chronic pain of right knee 11/12/2020   Left lateral epicondylitis 12/28/2019   Chronic low back pain 11/16/2019   Leukocytosis 10/30/2019   Elevated hemoglobin (HCC) 10/30/2019   Tobacco abuse 06/13/2017   Atypical chest pain 05/30/2017   Gastroesophageal reflux disease without esophagitis 05/30/2017   Mixed hyperlipidemia 05/30/2017    [facility] April May, PT, DPT 09/25/2021, 2:00 PM  [facility] [address] [place], [place], [place] Phone:  [phone]   Fax:  [phone]  Name: Kaeya Schiffer MRN: Berline Chough Date of Birth: [birth year]

## 2021-09-30 ENCOUNTER — Ambulatory Visit (INDEPENDENT_AMBULATORY_CARE_PROVIDER_SITE_OTHER): Payer: BC Managed Care – PPO | Admitting: Rehabilitative and Restorative Service Providers"

## 2021-09-30 ENCOUNTER — Encounter: Payer: Self-pay | Admitting: Rehabilitative and Restorative Service Providers"

## 2021-09-30 ENCOUNTER — Other Ambulatory Visit: Payer: Self-pay

## 2021-09-30 DIAGNOSIS — M79632 Pain in left forearm: Secondary | ICD-10-CM

## 2021-09-30 DIAGNOSIS — M542 Cervicalgia: Secondary | ICD-10-CM | POA: Diagnosis not present

## 2021-09-30 DIAGNOSIS — M5412 Radiculopathy, cervical region: Secondary | ICD-10-CM

## 2021-09-30 DIAGNOSIS — M25522 Pain in left elbow: Secondary | ICD-10-CM | POA: Diagnosis not present

## 2021-09-30 DIAGNOSIS — R293 Abnormal posture: Secondary | ICD-10-CM | POA: Diagnosis not present

## 2021-09-30 DIAGNOSIS — M545 Low back pain, unspecified: Secondary | ICD-10-CM

## 2021-09-30 NOTE — Therapy (Signed)
[facility] [place] [address] [place], [place], [address] Phone: [phone]   Fax:  [phone]  Physical Therapy Treatment  Patient Details  Name: Judith [name] MRN: [id number] Date of Birth: 12/07/1965 Referring Provider (PT): Monica Becton, MD   Encounter Date: 09/30/2021   PT End of Session - 09/30/21 0719     Visit Number 6    Number of Visits 12    Date for PT Re-Evaluation 10/16/21    PT Start Time 0715    PT Stop Time 0804    PT Time Calculation (min) 49 min    Activity Tolerance Patient tolerated treatment well             Past Medical History:  Diagnosis Date   Anxiety    Colon polyps    Hypercholesteremia     History reviewed. No pertinent surgical history.  There were no vitals filed for this visit.   Subjective Assessment - 09/30/21 0719     Subjective Some improvement. Less tingling in the hands during the day. Still fall asleep at night. Working to change positions for sleep at night. DN was OK - thinks it may have helped.    Currently in Pain? Yes    Pain Score 1     Pain Location Elbow    Pain Orientation Left    Pain Descriptors / Indicators Aching    Pain Type Chronic pain    Pain Radiating Towards still having throbbing at night but not into the fingers - now elbow and forearm - having some LBP which is chronic in nature    Pain Onset More than a month ago    Pain Frequency Intermittent    Aggravating Factors  sleeping    Pain Relieving Factors keeping arm propped up in right position; antiinflammatory; heat    Effect of Pain on Daily Activities difficulty sleeping                [facility] PT Assessment - 09/30/21 0001       Assessment   Medical Diagnosis M54.12 (ICD-10-CM) - Radiculitis of left cervical region    Referring Provider (PT) Monica Becton, MD    Onset Date/Surgical Date --   ~1 year   Hand Dominance Right    Prior Therapy Has done exercises given  from ortho for golfer's elbow and tennis elbow      Sensation   Additional Comments intermittent numbness thorugh the Lt UE at night - difficult to sleep      Posture/Postural Control   Posture Comments head forward posture; shoulders rounded and elevated      Palpation   Spinal mobility hypomobile mid to upper cervical spine    Palpation comment muscular tightness anterior/lateral/posterior cervical greatest Lt C/4/5; pecs; upper trap; leveator                           [facility] Adult PT Treatment/Exercise - 09/30/21 0001       Shoulder Exercises: Supine   Other Supine Exercises 4 part core 10 sec x 10 reps - to address LBP improve spinal stabilization    Other Supine Exercises chintuck 10 sec x 5 reps      Shoulder Exercises: Standing   Retraction Strengthening;Both;10 reps;Theraband    Theraband Level (Shoulder Retraction) Level 1 (Yellow)    Retraction Limitations working on postrure and alignment engaging posterior shoulder girdle    Other Standing Exercises scap  squeeze 10 sec x 10; L's x 10; W's x 10 with swim noodle along spine    Other Standing Exercises W with TB x 10 reps yellow TB      Shoulder Exercises: Stretch   Other Shoulder Stretches doorway stretch 3 positions 20-30 sec x 2 reps each position - notes less tingling at 10-15 sec resolves as she releases stretch      Moist Heat Therapy   Number Minutes Moist Heat 10 Minutes    Moist Heat Location Cervical;Shoulder      Electrical Stimulation   Electrical Stimulation Location Lt cervical/shoulder    Electrical Stimulation Action TENS    Electrical Stimulation Parameters to tolerance    Electrical Stimulation Goals Pain;Tone      Manual Therapy   Joint Mobilization cervical PA and lateral mobs Grade II to III    Soft tissue mobilization deeo tissue work through the ant/lat/posterior cervical musculature; pecs; upper traps Lt > Rt    Myofascial Release anterior chest; myofacial release work  through the [employer] UE    Manual Traction cervical traction 10-15 sec hold x 4 reps during treatment                          PT Long Term Goals - 09/04/21 1603       PT LONG TERM GOAL #1   Title Pt will be independent with HEP    Time 6    Period Weeks    Status New    Target Date 10/16/21      PT LONG TERM GOAL #2   Title Pt will have improved cervical ROM by at least 10 deg with lateral flexion and rotation    Time 6    Period Weeks    Status New    Target Date 10/16/21      PT LONG TERM GOAL #3   Title Pt will report decrease in N/T by at least 75% in her fingers    Time 6    Period Weeks    Status New    Target Date 10/16/21      PT LONG TERM GOAL #4   Title Pt will report decrease in her neck/upper back pain by at least 75%    Time 6    Period Weeks    Status New    Target Date 10/16/21      PT LONG TERM GOAL #5   Title Pt will be able to lift and carry at least 20# of groceries without pain    Time 6    Period Weeks    Status New    Target Date 10/16/21                   Plan - 09/30/21 0724     Clinical Impression Statement Gradual improvement with fewer radicular symptoms during the day. Still has pain in the elbow and forearm at night. no longer feeling numbness into the hands.    Rehab Potential Good    PT Frequency 2x / week    PT Duration 6 weeks    PT Treatment/Interventions ADLs/Self Care Home Management;Cryotherapy;Electrical Stimulation;Iontophoresis 4mg /ml Dexamethasone;Moist Heat;Ultrasound;Functional mobility training;Therapeutic activities;Therapeutic exercise;Neuromuscular re-education;Manual techniques;Patient/family education;Dry needling;Passive range of motion;Taping    PT Next Visit Plan Work on improving cervical ROM and postural alignment. Initiate strengthening posterior shoulder girdle, biceps, wrist, forearms. Continue nerve mobilization/gliding; postural correction; manual work as tolerated    PT Home Exercise  Plan [email or web address]    Consulted and Agree with Plan of Care Patient             Patient will benefit from skilled therapeutic intervention in order to improve the following deficits and impairments:     Visit Diagnosis: Cervicalgia  Abnormal posture  Bilateral low back pain without sciatica, unspecified chronicity  Pain in left elbow  Pain in left forearm  Radiculopathy, cervical region     Problem List Patient Active Problem List   Diagnosis Date Noted   Radiculitis of left cervical region 08/27/2021   Primary insomnia 11/12/2020   Chronic pain of right knee 11/12/2020   Left lateral epicondylitis 12/28/2019   Chronic low back pain 11/16/2019   Leukocytosis 10/30/2019   Elevated hemoglobin (HCC) 10/30/2019   Tobacco abuse 06/13/2017   Atypical chest pain 05/30/2017   Gastroesophageal reflux disease without esophagitis 05/30/2017   Mixed hyperlipidemia 05/30/2017    Leor Whyte Rober Minion, PT, MPH  09/30/2021, 8:16 AM  [facility] [address] [place] [address] [place], [place], [address] Phone: [phone]   Fax:  [phone]  Name: Judith [name] MRN: [id number] Date of Birth: [birth year]

## 2021-10-02 ENCOUNTER — Encounter: Payer: BC Managed Care – PPO | Admitting: Physical Therapy

## 2021-10-02 ENCOUNTER — Encounter: Payer: Self-pay | Admitting: Rehabilitative and Restorative Service Providers"

## 2021-10-02 ENCOUNTER — Ambulatory Visit (INDEPENDENT_AMBULATORY_CARE_PROVIDER_SITE_OTHER): Payer: BC Managed Care – PPO | Admitting: Rehabilitative and Restorative Service Providers"

## 2021-10-02 ENCOUNTER — Other Ambulatory Visit: Payer: Self-pay

## 2021-10-02 DIAGNOSIS — M25522 Pain in left elbow: Secondary | ICD-10-CM | POA: Diagnosis not present

## 2021-10-02 DIAGNOSIS — R293 Abnormal posture: Secondary | ICD-10-CM | POA: Diagnosis not present

## 2021-10-02 DIAGNOSIS — M542 Cervicalgia: Secondary | ICD-10-CM | POA: Diagnosis not present

## 2021-10-02 DIAGNOSIS — M545 Low back pain, unspecified: Secondary | ICD-10-CM

## 2021-10-02 DIAGNOSIS — M5412 Radiculopathy, cervical region: Secondary | ICD-10-CM

## 2021-10-02 DIAGNOSIS — M79632 Pain in left forearm: Secondary | ICD-10-CM

## 2021-10-02 NOTE — Therapy (Signed)
[facility] [place] [address] [place] [address] [place], [place], [address] Phone: [phone]   Fax:  [phone]  Physical Therapy Treatment  Patient Details  Name: Caedence Snowden MRN: [id number] Date of Birth: 14-Feb-1966 Referring Provider (PT): Monica Becton, MD   Encounter Date: 10/02/2021   PT End of Session - 10/02/21 0929     Visit Number 7    Number of Visits 12    Date for PT Re-Evaluation 10/16/21    Authorization Type BCBS    PT Start Time 0929    PT Stop Time 1015    PT Time Calculation (min) 46 min    Activity Tolerance Patient tolerated treatment well             Past Medical History:  Diagnosis Date   Anxiety    Colon polyps    Hypercholesteremia     History reviewed. No pertinent surgical history.  There were no vitals filed for this visit.   Subjective Assessment - 10/02/21 0930     Subjective Two good days and better sleeping in the past two nights. Noticed less pain in the Lt arm. Has had some throbbing in Lt > Rt elbow and forearm areas today.    Currently in Pain? Yes    Pain Score 3     Pain Orientation Left    Pain Descriptors / Indicators Aching;Throbbing    Pain Type Chronic pain                               [facility] Adult PT Treatment/Exercise - 10/02/21 0001       Therapeutic Activites    Therapeutic Activities Other Therapeutic Activities    Other Therapeutic Activities occipital inhibition using golf balls 1-2 min      Shoulder Exercises: Standing   Row Strengthening;Both;10 reps;Theraband    Theraband Level (Shoulder Row) Level 3 (Green)    Row Limitations isometric step back    Retraction Strengthening;Both;10 reps;Theraband    Theraband Level (Shoulder Retraction) Level 1 (Yellow)    Retraction Limitations working on postrure and alignment engaging posterior shoulder girdle    Diagonals Limitations bow and arrow green TB x 10 each side    Other Standing  Exercises scap squeeze 10 sec x 10; L's x 10; W's x 10 with swim noodle along spine    Other Standing Exercises W with TB x 10 reps yellow TB      Shoulder Exercises: Stretch   Other Shoulder Stretches doorway stretch 3 positions 20-30 sec x 2 reps each position - added shoulder flexion with dowel in doorway 30 sec x 2      Manual Therapy   Joint Mobilization cervical PA and lateral mobs Grade II to III    Soft tissue mobilization deeo tissue work through the ant/lat/posterior cervical musculature; pecs; upper traps Lt > Rt    Myofascial Release anterior chest; myofacial release work through the [employer] UE    Manual Traction cervical traction 10-15 sec hold x 4 reps during treatment                     PT Education - 10/02/21 0945     Education Details HEP    Person(s) Educated Patient    Methods Explanation;Demonstration;Tactile cues;Verbal cues;Handout    Comprehension Verbalized understanding;Returned demonstration;Verbal cues required;Tactile cues required  PT Long Term Goals - 09/04/21 1603       PT LONG TERM GOAL #1   Title Pt will be independent with HEP    Time 6    Period Weeks    Status New    Target Date 10/16/21      PT LONG TERM GOAL #2   Title Pt will have improved cervical ROM by at least 10 deg with lateral flexion and rotation    Time 6    Period Weeks    Status New    Target Date 10/16/21      PT LONG TERM GOAL #3   Title Pt will report decrease in N/T by at least 75% in her fingers    Time 6    Period Weeks    Status New    Target Date 10/16/21      PT LONG TERM GOAL #4   Title Pt will report decrease in her neck/upper back pain by at least 75%    Time 6    Period Weeks    Status New    Target Date 10/16/21      PT LONG TERM GOAL #5   Title Pt will be able to lift and carry at least 20# of groceries without pain    Time 6    Period Weeks    Status New    Target Date 10/16/21                   Plan -  10/02/21 0935     Clinical Impression Statement Good response to manual work and passive stretch through the cervical and shoulder girdle areas with decreased radicular symptoms and improved sleep.    Rehab Potential Good    PT Frequency 2x / week    PT Duration 6 weeks    PT Treatment/Interventions ADLs/Self Care Home Management;Cryotherapy;Electrical Stimulation;Iontophoresis 4mg /ml Dexamethasone;Moist Heat;Ultrasound;Functional mobility training;Therapeutic activities;Therapeutic exercise;Neuromuscular re-education;Manual techniques;Patient/family education;Dry needling;Passive range of motion;Taping    PT Next Visit Plan Work on improving cervical ROM and postural alignment. Initiate strengthening posterior shoulder girdle, biceps, wrist, forearms. Continue nerve mobilization/gliding; postural correction; manual work as tolerated    PT Home Exercise Plan [email or web address]    Consulted and Agree with Plan of Care Patient             Patient will benefit from skilled therapeutic intervention in order to improve the following deficits and impairments:     Visit Diagnosis: Cervicalgia  Abnormal posture  Bilateral low back pain without sciatica, unspecified chronicity  Pain in left elbow  Pain in left forearm  Radiculopathy, cervical region     Problem List Patient Active Problem List   Diagnosis Date Noted   Radiculitis of left cervical region 08/27/2021   Primary insomnia 11/12/2020   Chronic pain of right knee 11/12/2020   Left lateral epicondylitis 12/28/2019   Chronic low back pain 11/16/2019   Leukocytosis 10/30/2019   Elevated hemoglobin (HCC) 10/30/2019   Tobacco abuse 06/13/2017   Atypical chest pain 05/30/2017   Gastroesophageal reflux disease without esophagitis 05/30/2017   Mixed hyperlipidemia 05/30/2017    Yoltzin Ransom 07/30/2017, PT, MPH  10/02/2021, 10:13 AM  [facility] [address] [place], [place],  [place] Phone: [phone]   Fax:  [phone]  Name: Hanah Moultry MRN: Berline Chough Date of Birth: 16-Apr-1966

## 2021-10-02 NOTE — Patient Instructions (Signed)
Access Code: [email or web address] URL: [email or web address] Date: 10/02/2021 Prepared by: Corlis Leak  Exercises Seated Upper Trapezius Stretch - 3 x daily - 7 x weekly - 1 sets - 30 sec hold Seated Levator Scapulae Stretch - 1 x daily - 7 x weekly - 1 sets - 30 sec hold Seated Scapular Retraction - 1 x daily - 7 x weekly - 2 sets - 10 reps Median Nerve Mobilization - 1 x daily - 7 x weekly - 1 sets - 10 reps Standing Radial Nerve Glide - 1 x daily - 7 x weekly - 1 sets - 10 reps Shoulder External Rotation and Scapular Retraction - 2 x daily - 7 x weekly - 1 sets - 10 reps - 3-5 sec hold Shoulder External Rotation in 45 Degrees Abduction - 2 x daily - 7 x weekly - 1-2 sets - 10 reps - 3-5 sec hold Doorway Pec Stretch at 60 Degrees Abduction - 3 x daily - 7 x weekly - 1 sets - 3 reps Doorway Pec Stretch at 90 Degrees Abduction - 3 x daily - 7 x weekly - 1 sets - 3 reps - 30 seconds hold Doorway Pec Stretch at 120 Degrees Abduction - 3 x daily - 7 x weekly - 1 sets - 3 reps - 30 second hold hold Shoulder External Rotation and Scapular Retraction with Resistance - 2 x daily - 7 x weekly - 1-3 sets - 10 reps - 3-5 sec hold Shoulder W - External Rotation with Resistance - 2 x daily - 7 x weekly - 1-3 sets - 10 reps - 3 sec hold Prone Scapular Retraction - 2 x daily - 7 x weekly - 1 sets - 5-10 reps - 3-5 sec hold Seated Assisted Cervical Rotation with Towel - 1 x daily - 7 x weekly - 1 sets - 10 reps - 5 sec hold Supine Deep Neck Flexor Training - 1 x daily - 7 x weekly - 2 sets - 5 reps - 5 sec hold Supine Transversus Abdominis Bracing with Pelvic Floor Contraction - 2 x daily - 7 x weekly - 1 sets - 10 reps - 10sec hold Standing Shoulder Row Reactive Isometric - 2 x daily - 7 x weekly - 1 sets - 10 reps - 3-5 sec hold Drawing Bow - 1 x daily - 7 x weekly - 1 sets - 10 reps - 3 sec hold

## 2021-10-03 ENCOUNTER — Other Ambulatory Visit: Payer: Self-pay | Admitting: Medical-Surgical

## 2021-10-03 DIAGNOSIS — E782 Mixed hyperlipidemia: Secondary | ICD-10-CM

## 2021-10-05 ENCOUNTER — Other Ambulatory Visit: Payer: Self-pay

## 2021-10-05 ENCOUNTER — Ambulatory Visit (INDEPENDENT_AMBULATORY_CARE_PROVIDER_SITE_OTHER): Payer: BC Managed Care – PPO | Admitting: Physical Therapy

## 2021-10-05 DIAGNOSIS — M25522 Pain in left elbow: Secondary | ICD-10-CM

## 2021-10-05 DIAGNOSIS — M5412 Radiculopathy, cervical region: Secondary | ICD-10-CM

## 2021-10-05 DIAGNOSIS — M79632 Pain in left forearm: Secondary | ICD-10-CM

## 2021-10-05 DIAGNOSIS — M542 Cervicalgia: Secondary | ICD-10-CM | POA: Diagnosis not present

## 2021-10-05 DIAGNOSIS — R293 Abnormal posture: Secondary | ICD-10-CM | POA: Diagnosis not present

## 2021-10-05 DIAGNOSIS — M545 Low back pain, unspecified: Secondary | ICD-10-CM | POA: Diagnosis not present

## 2021-10-05 NOTE — Therapy (Signed)
[facility] [place] [address] [place], [place], [address] Phone: [phone]   Fax:  [phone]  Physical Therapy Treatment  Patient Details  Name: Judith [name] MRN: [id number] Date of Birth: [birth year] Referring Provider (PT): Monica Becton, MD   Encounter Date: 10/05/2021   PT End of Session - 10/05/21 1105     Visit Number 8    Number of Visits 12    Date for PT Re-Evaluation 10/16/21    Authorization Type BCBS    PT Start Time 1105    PT Stop Time 1145    PT Time Calculation (min) 40 min    Activity Tolerance Patient tolerated treatment well    Behavior During Therapy [facility] for tasks assessed/performed             Past Medical History:  Diagnosis Date   Anxiety    Colon polyps    Hypercholesteremia     No past surgical history on file.  There were no vitals filed for this visit.   Subjective Assessment - 10/05/21 1106     Subjective Reports 4 good nights of sleep. Improvements in general.    Limitations House hold activities    How long can you sit comfortably? n/a    How long can you stand comfortably? n/a    How long can you walk comfortably? n/a    Patient Stated Goals Improve comfort with sleeping    Currently in Pain? Yes    Pain Score 1     Pain Location Elbow    Pain Orientation Left    Pain Descriptors / Indicators Aching;Throbbing    Pain Type Chronic pain    Pain Onset More than a month ago                [facility] PT Assessment - 10/05/21 0001       Assessment   Next MD Visit 10/08/21                           [facility] Adult PT Treatment/Exercise - 10/05/21 0001       Shoulder Exercises: Seated   External Rotation Strengthening;Both;20 reps;Theraband    Theraband Level (Shoulder External Rotation) Level 3 (Green)    External Rotation Limitations with neck retraction    Other Seated Exercises Neck retraction against red tband x10; lateral flexion x10;  rotation x5    Other Seated Exercises Low trap setting with yellow tband x10; "snow angel" x10      Shoulder Exercises: Standing   Row Strengthening;Both;10 reps;Theraband    Theraband Level (Shoulder Row) Level 3 (Green)    Row Limitations isometric step back    Diagonals Limitations bow and arrow green TB x 10 each side    Other Standing Exercises W with TB x 10 reps yellow TB      Shoulder Exercises: ROM/Strengthening   UBE (Upper Arm Bike) L2 x 2 min forward, 2 min backward      Manual Therapy   Joint Mobilization cervical PA and lateral mobs Grade II to III    Soft tissue mobilization deeo tissue work through the ant/lat/posterior cervical musculature; pecs; upper traps Lt > Rt    Myofascial Release anterior chest; myofacial release work through the [employer] UE    Manual Traction cervical traction 10-15 sec hold x 4 reps during treatment  PT Long Term Goals - 09/04/21 1603       PT LONG TERM GOAL #1   Title Pt will be independent with HEP    Time 6    Period Weeks    Status New    Target Date 10/16/21      PT LONG TERM GOAL #2   Title Pt will have improved cervical ROM by at least 10 deg with lateral flexion and rotation    Time 6    Period Weeks    Status New    Target Date 10/16/21      PT LONG TERM GOAL #3   Title Pt will report decrease in N/T by at least 75% in her fingers    Time 6    Period Weeks    Status New    Target Date 10/16/21      PT LONG TERM GOAL #4   Title Pt will report decrease in her neck/upper back pain by at least 75%    Time 6    Period Weeks    Status New    Target Date 10/16/21      PT LONG TERM GOAL #5   Title Pt will be able to lift and carry at least 20# of groceries without pain    Time 6    Period Weeks    Status New    Target Date 10/16/21                   Plan - 10/05/21 1144     Clinical Impression Statement Pt with continued gains with manual work and passive stretching.  Continued to progress shoulder girdle and cervical strengthening. Pt continues to progress towards her goals.    Personal Factors and Comorbidities Time since onset of injury/illness/exacerbation;Fitness    Examination-Activity Limitations Sleep;Lift;Carry    Examination-Participation Restrictions Community Activity;Yard Work;Occupation    Stability/Clinical Decision Making Evolving/Moderate complexity    Rehab Potential Good    PT Frequency 2x / week    PT Duration 6 weeks    PT Treatment/Interventions ADLs/Self Care Home Management;Cryotherapy;Electrical Stimulation;Iontophoresis 4mg /ml Dexamethasone;Moist Heat;Ultrasound;Functional mobility training;Therapeutic activities;Therapeutic exercise;Neuromuscular re-education;Manual techniques;Patient/family education;Dry needling;Passive range of motion;Taping    PT Next Visit Plan Work on improving cervical ROM and postural alignment. Initiate strengthening posterior shoulder girdle, biceps, wrist, forearms. Continue nerve mobilization/gliding; postural correction; manual work as tolerated    PT Home Exercise Plan [email or web address]    Consulted and Agree with Plan of Care Patient             Patient will benefit from skilled therapeutic intervention in order to improve the following deficits and impairments:  Decreased range of motion, Impaired UE functional use, Pain, Decreased mobility, Decreased strength, Impaired sensation, Postural dysfunction, Improper body mechanics  Visit Diagnosis: Cervicalgia  Abnormal posture  Bilateral low back pain without sciatica, unspecified chronicity  Pain in left elbow  Pain in left forearm  Radiculopathy, cervical region     Problem List Patient Active Problem List   Diagnosis Date Noted   Radiculitis of left cervical region 08/27/2021   Primary insomnia 11/12/2020   Chronic pain of right knee 11/12/2020   Left lateral epicondylitis 12/28/2019   Chronic low back pain 11/16/2019   Leukocytosis  10/30/2019   Elevated hemoglobin (HCC) 10/30/2019   Tobacco abuse 06/13/2017   Atypical chest pain 05/30/2017   Gastroesophageal reflux disease without esophagitis 05/30/2017   Mixed hyperlipidemia 05/30/2017    Scotti Motter April Ma L [place], PT, DPT 10/05/2021,  12:47 PM  [facility] [address] [place], [place], [address] Phone: [phone]   Fax:  [phone]  Name: Aashritha Miedema MRN: [id number] Date of Birth: 1965/11/10

## 2021-10-08 ENCOUNTER — Encounter: Payer: Self-pay | Admitting: Rehabilitative and Restorative Service Providers"

## 2021-10-08 ENCOUNTER — Ambulatory Visit (INDEPENDENT_AMBULATORY_CARE_PROVIDER_SITE_OTHER): Payer: BC Managed Care – PPO | Admitting: Sports Medicine

## 2021-10-08 ENCOUNTER — Other Ambulatory Visit: Payer: Self-pay

## 2021-10-08 ENCOUNTER — Ambulatory Visit (INDEPENDENT_AMBULATORY_CARE_PROVIDER_SITE_OTHER): Payer: BC Managed Care – PPO | Admitting: Rehabilitative and Restorative Service Providers"

## 2021-10-08 DIAGNOSIS — M542 Cervicalgia: Secondary | ICD-10-CM | POA: Diagnosis not present

## 2021-10-08 DIAGNOSIS — M79632 Pain in left forearm: Secondary | ICD-10-CM

## 2021-10-08 DIAGNOSIS — M5412 Radiculopathy, cervical region: Secondary | ICD-10-CM

## 2021-10-08 DIAGNOSIS — M545 Low back pain, unspecified: Secondary | ICD-10-CM

## 2021-10-08 DIAGNOSIS — R293 Abnormal posture: Secondary | ICD-10-CM

## 2021-10-08 DIAGNOSIS — M25522 Pain in left elbow: Secondary | ICD-10-CM

## 2021-10-08 NOTE — Progress Notes (Signed)
° ° °  Procedures performed today:    None.  Independent interpretation of notes and tests performed by another provider:   None.  Brief History, Exam, Impression, and Recommendations:    Radiculitis of left cervical region Judith [name] returns, she is a pleasant 55 year old female, left-sided C7 radiculitis, improved considerably with multiple sessions of physical therapy, prednisone. She is doing gabapentin 300 nightly. Unfortunately continues to have tightness and discomfort in her neck, she will do another session of therapy and likely switch to a home exercise program, she will also double her gabapentin to twice daily, I have given her instructions on how to increase from here as well. Due to greater than 6 weeks of conservative treatment, physical therapy without significant improvement we are going to proceed with MRI, but not yet with epidural, follow-up with me in a month.    ___________________________________________ [name]. [name], M.D., ABFM., CAQSM. Primary Care and Sports Medicine [facility]  Adjunct Instructor of Family Medicine  [employer]

## 2021-10-08 NOTE — Therapy (Signed)
[facility] [place] [address] [place], [place], [address] Phone: [phone]   Fax:  [phone]  Physical Therapy Treatment  Patient Details  Name: Judith [name] MRN: [id number] Date of Birth: Nov 07, 1965 Referring Provider (PT): Monica Becton, MD   Encounter Date: 10/08/2021   PT End of Session - 10/08/21 0809     Visit Number 9    Number of Visits 12    Date for PT Re-Evaluation 10/16/21    Authorization Type BCBS    PT Start Time 0808    PT Stop Time 0900    PT Time Calculation (min) 52 min    Activity Tolerance Patient tolerated treatment well             Past Medical History:  Diagnosis Date   Anxiety    Colon polyps    Hypercholesteremia     History reviewed. No pertinent surgical history.  There were no vitals filed for this visit.   Subjective Assessment - 10/08/21 0811     Subjective Not sleeping as well in the past two nights - busy with Christmas prep. Overall notes good improvement in symptoms.    Currently in Pain? Yes    Pain Score 3     Pain Location Elbow    Pain Orientation Left    Pain Descriptors / Indicators Aching;Nagging    Pain Type Chronic pain    Pain Radiating Towards less throbbing at night but still has some symptoms at times mostly at night - some LBP which is chronic    Pain Onset More than a month ago    Pain Frequency Intermittent    Pain Score 3    Pain Location Back    Pain Orientation Upper    Pain Descriptors / Indicators Aching;Nagging    Pain Type Chronic pain                [facility] PT Assessment - 10/08/21 0001       Assessment   Medical Diagnosis M54.12 (ICD-10-CM) - Radiculitis of left cervical region    Referring Provider (PT) Monica Becton, MD    Onset Date/Surgical Date --   ~ 1 year   Hand Dominance Right    Next MD Visit 10/08/21    Prior Therapy Has done exercises given from ortho for golfer's elbow and tennis elbow       Sensation   Additional Comments intermittent numbness thorugh the Lt UE at night - difficult to sleep      Posture/Postural Control   Posture Comments head forward posture; shoulders rounded and elevated      AROM   Cervical Flexion 38    Cervical Extension 35    Cervical - Right Side Bend 30    Cervical - Left Side Bend 26    Cervical - Right Rotation 51    Cervical - Left Rotation 50      Strength   Right Shoulder Flexion 5/5    Right Shoulder ABduction 5/5    Right Shoulder External Rotation 4+/5    Left Shoulder Flexion 5/5    Left Shoulder ABduction 5/5    Left Shoulder External Rotation 4+/5      Palpation   Spinal mobility hypomobile mid to upper cervical spine    Palpation comment muscular tightness anterior/lateral/posterior cervical greatest Lt C/4/5; pecs; upper trap; leveator  [facility] Adult PT Treatment/Exercise - 10/08/21 0001       Shoulder Exercises: Seated   External Rotation Strengthening;Both;20 reps;Theraband    Theraband Level (Shoulder External Rotation) Level 3 (Green)      Shoulder Exercises: Standing   Row Strengthening;Both;10 reps;Theraband    Theraband Level (Shoulder Row) Level 4 (Blue)    Row Limitations isometric step back    Retraction Strengthening;Both;10 reps;Theraband    Theraband Level (Shoulder Retraction) Level 2 (Red)    Retraction Limitations working on [employer] and alignment engaging posterior shoulder girdle    Diagonals Limitations bow and arrow blue TB x 10 each side    Other Standing Exercises W with TB x 10 reps red TB      Shoulder Exercises: Stretch   Other Shoulder Stretches doorway stretch 3 positions 20-30 sec x 2 reps each position - added shoulder flexion with dowel in doorway 30 sec x 2    Other Shoulder Stretches lateral cervical flexion 15-20 sec x 4 each side      Moist Heat Therapy   Number Minutes Moist Heat 10 Minutes    Moist Heat Location Cervical;Shoulder       Manual Therapy   Joint Mobilization cervical PA and lateral mobs Grade II to III    Soft tissue mobilization deep tissue work through the ant/lat/posterior cervical musculature; pecs; upper traps Lt > Rt    Myofascial Release anterior chest; myofacial release work through the [employer] UE    Passive ROM PROM/stretch cervical flexion; flexion with slight rotation; lateral flexion 15-20 sec hold x 2 reps each direction    Manual Traction cervical traction 10-15 sec hold x 4 reps during treatment                     PT Education - 10/08/21 0823     Education Details HEP    Person(s) Educated Patient    Methods Explanation;Demonstration;Tactile cues;Verbal cues;Handout    Comprehension Verbalized understanding;Returned demonstration;Verbal cues required;Tactile cues required                 PT Long Term Goals - 10/08/21 0809       PT LONG TERM GOAL #1   Title Pt will be independent with HEP    Time 6    Period Weeks    Status On-going    Target Date 10/16/21      PT LONG TERM GOAL #2   Title Pt will have improved cervical ROM by at least 10 deg with lateral flexion and rotation    Time 6    Period Weeks    Status On-going    Target Date 10/16/21      PT LONG TERM GOAL #3   Title Pt will report decrease in N/T by at least 75% in her fingers    Time 6    Period Weeks    Status Achieved    Target Date 10/16/21      PT LONG TERM GOAL #4   Title Pt will report decrease in her neck/upper back pain by at least 75%    Time 6    Period Weeks    Status On-going    Target Date 10/16/21      PT LONG TERM GOAL #5   Title Pt will be able to lift and carry at least 20# of groceries without pain    Time 6    Period Weeks    Status On-going    Target Date  10/16/21                   Plan - 10/08/21 0815     Clinical Impression Statement Progressing gradually toward rehab goals. Patient reports less pain in Lt UE and upper back/neck. She is generally sleeping  betterat night with less radicular pain into Lt UE. She demonstrates increasing cervical mobility. She continues to have muscular tightness to palpation. Patient will benefit from continued treatment to achieve maximum rehab potental.    Rehab Potential Good    PT Frequency 2x / week    PT Duration 6 weeks    PT Treatment/Interventions ADLs/Self Care Home Management;Cryotherapy;Electrical Stimulation;Iontophoresis 4mg /ml Dexamethasone;Moist Heat;Ultrasound;Functional mobility training;Therapeutic activities;Therapeutic exercise;Neuromuscular re-education;Manual techniques;Patient/family education;Dry needling;Passive range of motion;Taping    PT Next Visit Plan Work on improving cervical ROM and postural alignment. Initiate strengthening posterior shoulder girdle, biceps, wrist, forearms. Continue nerve mobilization/gliding; postural correction; manual work as tolerated    PT Home Exercise Plan [email or web address]    Consulted and Agree with Plan of Care Patient             Patient will benefit from skilled therapeutic intervention in order to improve the following deficits and impairments:     Visit Diagnosis: Cervicalgia  Abnormal posture  Bilateral low back pain without sciatica, unspecified chronicity  Pain in left elbow  Pain in left forearm  Radiculopathy, cervical region     Problem List Patient Active Problem List   Diagnosis Date Noted   Radiculitis of left cervical region 08/27/2021   Primary insomnia 11/12/2020   Chronic pain of right knee 11/12/2020   Left lateral epicondylitis 12/28/2019   Chronic low back pain 11/16/2019   Leukocytosis 10/30/2019   Elevated hemoglobin (HCC) 10/30/2019   Tobacco abuse 06/13/2017   Atypical chest pain 05/30/2017   Gastroesophageal reflux disease without esophagitis 05/30/2017   Mixed hyperlipidemia 05/30/2017    Ethen Bannan 07/30/2017, PT, MPH 10/08/2021, 8:49 AM  [facility] [address] [place], [place], [place] Phone: [phone]   Fax:  [phone]  Name: Judith [name] MRN: Berline Chough Date of Birth: [birth year]

## 2021-10-08 NOTE — Assessment & Plan Note (Signed)
Judith [name] returns, she is a pleasant 55 year old female, left-sided C7 radiculitis, improved considerably with multiple sessions of physical therapy, prednisone. She is doing gabapentin 300 nightly. Unfortunately continues to have tightness and discomfort in her neck, she will do another session of therapy and likely switch to a home exercise program, she will also double her gabapentin to twice daily, I have given her instructions on how to increase from here as well. Due to greater than 6 weeks of conservative treatment, physical therapy without significant improvement we are going to proceed with MRI, but not yet with epidural, follow-up with me in a month.

## 2021-10-08 NOTE — Patient Instructions (Signed)
Access Code: [email or web address] URL: [email or web address] Date: 10/08/2021 Prepared by: Corlis Leak  Exercises Seated Upper Trapezius Stretch - 3 x daily - 7 x weekly - 1 sets - 30 sec hold Seated Levator Scapulae Stretch - 1 x daily - 7 x weekly - 1 sets - 30 sec hold Seated Scapular Retraction - 1 x daily - 7 x weekly - 2 sets - 10 reps Median Nerve Mobilization - 1 x daily - 7 x weekly - 1 sets - 10 reps Standing Radial Nerve Glide - 1 x daily - 7 x weekly - 1 sets - 10 reps Shoulder External Rotation and Scapular Retraction - 2 x daily - 7 x weekly - 1 sets - 10 reps - 3-5 sec hold Shoulder External Rotation in 45 Degrees Abduction - 2 x daily - 7 x weekly - 1-2 sets - 10 reps - 3-5 sec hold Doorway Pec Stretch at 60 Degrees Abduction - 3 x daily - 7 x weekly - 1 sets - 3 reps Doorway Pec Stretch at 90 Degrees Abduction - 3 x daily - 7 x weekly - 1 sets - 3 reps - 30 seconds hold Doorway Pec Stretch at 120 Degrees Abduction - 3 x daily - 7 x weekly - 1 sets - 3 reps - 30 second hold hold Shoulder External Rotation and Scapular Retraction with Resistance - 2 x daily - 7 x weekly - 1-3 sets - 10 reps - 3-5 sec hold Shoulder W - External Rotation with Resistance - 2 x daily - 7 x weekly - 1-3 sets - 10 reps - 3 sec hold Prone Scapular Retraction - 2 x daily - 7 x weekly - 1 sets - 5-10 reps - 3-5 sec hold Seated Assisted Cervical Rotation with Towel - 1 x daily - 7 x weekly - 1 sets - 10 reps - 5 sec hold Supine Deep Neck Flexor Training - 1 x daily - 7 x weekly - 2 sets - 5 reps - 5 sec hold Supine Transversus Abdominis Bracing with Pelvic Floor Contraction - 2 x daily - 7 x weekly - 1 sets - 10 reps - 10sec hold Standing Shoulder Row Reactive Isometric - 2 x daily - 7 x weekly - 1 sets - 10 reps - 3-5 sec hold Drawing Bow - 1 x daily - 7 x weekly - 1 sets - 10 reps - 3 sec hold Seated Cervical Sidebending Stretch - 2 x daily - 7 x weekly - 1 sets - 3-5 reps - 5-10 sec hold

## 2021-10-11 ENCOUNTER — Ambulatory Visit (INDEPENDENT_AMBULATORY_CARE_PROVIDER_SITE_OTHER): Payer: BC Managed Care – PPO

## 2021-10-11 ENCOUNTER — Other Ambulatory Visit: Payer: Self-pay

## 2021-10-11 DIAGNOSIS — R202 Paresthesia of skin: Secondary | ICD-10-CM | POA: Diagnosis not present

## 2021-10-11 DIAGNOSIS — M5412 Radiculopathy, cervical region: Secondary | ICD-10-CM | POA: Diagnosis not present

## 2021-10-13 ENCOUNTER — Ambulatory Visit (INDEPENDENT_AMBULATORY_CARE_PROVIDER_SITE_OTHER): Payer: BC Managed Care – PPO | Admitting: Rehabilitative and Restorative Service Providers"

## 2021-10-13 ENCOUNTER — Other Ambulatory Visit: Payer: Self-pay

## 2021-10-13 ENCOUNTER — Encounter: Payer: Self-pay | Admitting: Rehabilitative and Restorative Service Providers"

## 2021-10-13 DIAGNOSIS — M542 Cervicalgia: Secondary | ICD-10-CM | POA: Diagnosis not present

## 2021-10-13 DIAGNOSIS — M25522 Pain in left elbow: Secondary | ICD-10-CM

## 2021-10-13 DIAGNOSIS — M545 Low back pain, unspecified: Secondary | ICD-10-CM

## 2021-10-13 DIAGNOSIS — M79632 Pain in left forearm: Secondary | ICD-10-CM

## 2021-10-13 DIAGNOSIS — R293 Abnormal posture: Secondary | ICD-10-CM

## 2021-10-13 DIAGNOSIS — M5412 Radiculopathy, cervical region: Secondary | ICD-10-CM

## 2021-10-13 NOTE — Therapy (Signed)
[facility] [place] [address] [place], [place], [address] Phone: [phone]   Fax:  [phone]  Physical Therapy Treatment  Patient Details  Name: Judith [name] MRN: [id number] Date of Birth: 10-Feb-1966 Referring Provider (PT): Monica Becton, MD   Encounter Date: 10/13/2021   PT End of Session - 10/13/21 0842     Visit Number 10    Number of Visits 12    Date for PT Re-Evaluation 10/16/21    Authorization Type BCBS    PT Start Time 0843    PT Stop Time 0931    PT Time Calculation (min) 48 min    Activity Tolerance Patient tolerated treatment well             Past Medical History:  Diagnosis Date   Anxiety    Colon polyps    Hypercholesteremia     History reviewed. No pertinent surgical history.  There were no vitals filed for this visit.   Subjective Assessment - 10/13/21 0843     Subjective Not the greatest. Just not feeling all that well. Saw Dr T and said she could continue with independent HEP if she wants. She would like to continue for now since she is making progress. She did have an MRI which showed degenerative changes. She was sleeping better a week and a half ago she had less pain for a couple of nights but the last couple of nights things have been worse again.    Currently in Pain? Yes    Pain Score 3     Pain Orientation Left    Pain Descriptors / Indicators Aching;Nagging    Pain Type Chronic pain                [facility] PT Assessment - 10/13/21 0001       Assessment   Medical Diagnosis M54.12 (ICD-10-CM) - Radiculitis of left cervical region    Referring Provider (PT) Monica Becton, MD    Onset Date/Surgical Date --   ~ 1 year   Hand Dominance Right    Next MD Visit 10/08/21    Prior Therapy Has done exercises given from ortho for golfer's elbow and tennis elbow      Sensation   Additional Comments intermittent numbness thorugh the Lt UE at night - difficult to  sleep      Posture/Postural Control   Posture Comments head forward posture; shoulders rounded and elevated      AROM   Cervical Flexion 37    Cervical - Right Side Bend 32    Cervical - Left Side Bend 27    Cervical - Right Rotation 41    Cervical - Left Rotation 43      Palpation   Spinal mobility hypomobile mid to upper cervical spine    Palpation comment muscular tightness anterior/lateral/posterior cervical greatest Lt C/4/5; pecs; upper trap; leveator                           [facility] Adult PT Treatment/Exercise - 10/13/21 0001       Shoulder Exercises: Seated   External Rotation Strengthening;Both;20 reps;Theraband    Theraband Level (Shoulder External Rotation) Level 3 (Green)      Shoulder Exercises: Standing   Row Strengthening;Both;10 reps;Theraband    Theraband Level (Shoulder Row) Level 4 (Blue)    Row Limitations isometric step back    Retraction Strengthening;Both;10 reps;Theraband    Theraband Level (Shoulder  Retraction) Level 2 (Red)    Retraction Limitations working on [employer] and alignment engaging posterior shoulder girdle    Diagonals Limitations bow and arrow blue TB x 10 each side    Other Standing Exercises W with TB x 10 reps red TB      Shoulder Exercises: ROM/Strengthening   UBE (Upper Arm Bike) L4 x 2 min forward, 2 min backward      Shoulder Exercises: Stretch   Other Shoulder Stretches doorway stretch 3 positions 20-30 sec x 2 reps each position - added shoulder flexion with dowel in doorway 30 sec x 2    Other Shoulder Stretches lateral cervical flexion 15-20 sec x 4 each side      Manual Therapy   Joint Mobilization cervical PA and lateral mobs Grade II to III    Soft tissue mobilization deep tissue work through the ant/lat/posterior cervical musculature; pecs; upper traps Lt > Rt    Myofascial Release anterior chest; myofacial release work through the [employer] ROM PROM/stretch cervical flexion; flexion with slight  rotation; lateral flexion 15-20 sec hold x 2 reps each direction    Manual Traction cervical traction 10-15 sec hold x 4 reps during treatment              Trigger Point Dry Needling - 10/13/21 0001     Consent Given? Yes    Education Handout Provided Previously provided    Other Dry Needling Lt side    Cervical multifidi Response Palpable increased muscle length                        PT Long Term Goals - 10/08/21 0809       PT LONG TERM GOAL #1   Title Pt will be independent with HEP    Time 6    Period Weeks    Status On-going    Target Date 10/16/21      PT LONG TERM GOAL #2   Title Pt will have improved cervical ROM by at least 10 deg with lateral flexion and rotation    Time 6    Period Weeks    Status On-going    Target Date 10/16/21      PT LONG TERM GOAL #3   Title Pt will report decrease in N/T by at least 75% in her fingers    Time 6    Period Weeks    Status Achieved    Target Date 10/16/21      PT LONG TERM GOAL #4   Title Pt will report decrease in her neck/upper back pain by at least 75%    Time 6    Period Weeks    Status On-going    Target Date 10/16/21      PT LONG TERM GOAL #5   Title Pt will be able to lift and carry at least 20# of groceries without pain    Time 6    Period Weeks    Status On-going    Target Date 10/16/21                   Plan - 10/13/21 0849     Clinical Impression Statement Continued up and down course of progress. Patient is not sure DN helped - trial of one needle Lt C3/4 area in area of dense palpable tightness. Flared up in the past few days likely related to change in activities during the holidays.  Patient has continued head forward posture and alignment with limited cervical mobility/ROM. Radicular symptoms are increased in the past week. Discussed the nature of musculoskeletal pain and that flare ups of symptoms should be expected.    Rehab Potential Good    PT Frequency 2x / week     PT Duration 6 weeks    PT Treatment/Interventions ADLs/Self Care Home Management;Cryotherapy;Electrical Stimulation;Iontophoresis 4mg /ml Dexamethasone;Moist Heat;Ultrasound;Functional mobility training;Therapeutic activities;Therapeutic exercise;Neuromuscular re-education;Manual techniques;Patient/family education;Dry needling;Passive range of motion;Taping    PT Next Visit Plan Work on improving cervical ROM and postural alignment. Initiate strengthening posterior shoulder girdle, biceps, wrist, forearms. Continue nerve mobilization/gliding; postural correction; manual work as tolerated    PT Home Exercise Plan [email or web address]    Consulted and Agree with Plan of Care Patient             Patient will benefit from skilled therapeutic intervention in order to improve the following deficits and impairments:     Visit Diagnosis: Cervicalgia  Abnormal posture  Bilateral low back pain without sciatica, unspecified chronicity  Pain in left elbow  Pain in left forearm  Radiculopathy, cervical region     Problem List Patient Active Problem List   Diagnosis Date Noted   Radiculitis of left cervical region 08/27/2021   Primary insomnia 11/12/2020   Chronic pain of right knee 11/12/2020   Left lateral epicondylitis 12/28/2019   Chronic low back pain 11/16/2019   Leukocytosis 10/30/2019   Elevated hemoglobin (HCC) 10/30/2019   Tobacco abuse 06/13/2017   Atypical chest pain 05/30/2017   Gastroesophageal reflux disease without esophagitis 05/30/2017   Mixed hyperlipidemia 05/30/2017    Aalyah Mansouri 07/30/2017, PT, MPH  10/13/2021, 9:29 AM  [facility] [address] [place], [place], [place] Phone: [phone]   Fax:  [phone]  Name: Charnice Zwilling MRN: Berline Chough Date of Birth: 12-Feb-1966

## 2021-10-15 ENCOUNTER — Encounter: Payer: BC Managed Care – PPO | Admitting: Rehabilitative and Restorative Service Providers"

## 2021-10-23 ENCOUNTER — Ambulatory Visit (INDEPENDENT_AMBULATORY_CARE_PROVIDER_SITE_OTHER): Payer: BC Managed Care – PPO | Admitting: Physical Therapy

## 2021-10-23 ENCOUNTER — Other Ambulatory Visit: Payer: Self-pay

## 2021-10-23 DIAGNOSIS — M25522 Pain in left elbow: Secondary | ICD-10-CM

## 2021-10-23 DIAGNOSIS — M5412 Radiculopathy, cervical region: Secondary | ICD-10-CM

## 2021-10-23 DIAGNOSIS — M545 Low back pain, unspecified: Secondary | ICD-10-CM

## 2021-10-23 DIAGNOSIS — M79632 Pain in left forearm: Secondary | ICD-10-CM

## 2021-10-23 DIAGNOSIS — M542 Cervicalgia: Secondary | ICD-10-CM

## 2021-10-23 DIAGNOSIS — R293 Abnormal posture: Secondary | ICD-10-CM

## 2021-10-29 ENCOUNTER — Encounter: Payer: Self-pay | Admitting: Sports Medicine

## 2021-10-29 NOTE — Therapy (Signed)
[place], [place], [address] Phone: [phone]   Fax:  [phone]  Physical Therapy Treatment and Discharge  Patient Details  Name: Judith [name] MRN: [id number] Date of Birth: 1966-04-10 Referring Provider (PT): Silverio Decamp, MD  PHYSICAL THERAPY DISCHARGE SUMMARY  Visits from Start of Care: 11  Current functional level related to goals / functional outcomes: See below   Remaining deficits: See below   Education / Equipment: See below   Patient agrees to discharge. Patient goals were partially met. Patient is being discharged due to the patient's request.   Encounter Date: 10/23/2021   PT End of Session - 11/05/21 1000     Visit Number 11    Number of Visits 12    Date for PT Re-Evaluation 10/16/21    Authorization Type BCBS    PT Start Time 1319    PT Stop Time 1400    PT Time Calculation (min) 41 min    Activity Tolerance Patient tolerated treatment well    Behavior During Therapy WFL for tasks assessed/performed             Past Medical History:  Diagnosis Date   Anxiety    Colon polyps    Hypercholesteremia     No past surgical history on file.  There were no vitals filed for this visit.   Subjective Assessment - 11/05/21 0957     Subjective Pt reports elbow has been mostly better. Neck flare up has been reduced but she can still feel it. Neck feels tight. Pt reports follow up with Dr. Darene Lamer.    Limitations House hold activities    Patient Stated Goals Improve comfort with sleeping    Currently in Pain? Yes    Pain Score 2     Pain Location Elbow    Pain Orientation Left                [facility] PT Assessment - 11/05/21 0001       Assessment   Medical Diagnosis M54.12 (ICD-10-CM) - Radiculitis of left cervical region    Referring Provider (PT) Silverio Decamp, MD    Hand Dominance Right    Next MD Visit 10/08/21      AROM   Cervical  Flexion 38    Cervical - Right Side Bend 35    Cervical - Left Side Bend 26    Cervical - Right Rotation 65    Cervical - Left Rotation 55      Palpation   Spinal mobility hypomobile mid to upper cervical spine    Palpation comment muscular tightness anterior/lateral/posterior cervical greatest Lt C/4/5; pecs; upper trap; leveator                 UBE 2 min forward, 2 min backward  Manual therapy:  STM and myofacial release UT, scalenes, SCM  Cervical traction 3x20 sec   PROM flexion, ext, lateral flexion, rotation  Therex:  Supine:  Neck retraction x10, cervical AROM in all directions x10 each Seated: Cervical rotation snags with towel x10 Trialed self traction in supine and sitting x20 sec holds Iso against red tband x10 in lateral flexion and extension Wrist flexion/ext 2x10 with 2lb weight Brachioradialis curl 2x10 with 2lb weight                    PT Long Term Goals - 11/05/21 0958       PT LONG TERM GOAL #1  Title Pt will be independent with HEP    Time 6    Period Weeks    Status Partially Met    Target Date 10/16/21      PT LONG TERM GOAL #2   Title Pt will have improved cervical ROM by at least 10 deg with lateral flexion and rotation    Time 6    Period Weeks    Status Partially Met    Target Date 10/16/21      PT LONG TERM GOAL #3   Title Pt will report decrease in N/T by at least 75% in her fingers    Time 6    Period Weeks    Status Achieved    Target Date 10/16/21      PT LONG TERM GOAL #4   Title Pt will report decrease in her neck/upper back pain by at least 75%    Time 6    Period Weeks    Status Partially Met    Target Date 10/16/21      PT LONG TERM GOAL #5   Title Pt will be able to lift and carry at least 20# of groceries without pain    Time 6    Period Weeks    Status Partially Met    Target Date 10/16/21                   Plan - 11/05/21 0957     Clinical Impression Statement At this time,  pt feels ready for PT d/c. She feels she cannot financially continue. Treatment thus focused primarily on reviewing HEP and discussing her self progressions at home. Provided exercises to work on her grip/forearm strength. Pt has partially met her LTGs.    Personal Factors and Comorbidities Time since onset of injury/illness/exacerbation;Fitness    Examination-Activity Limitations Sleep;Lift;Carry    Examination-Participation Restrictions Community Activity;Yard Work;Occupation    Rehab Potential Good    PT Frequency 2x / week    PT Duration 6 weeks    PT Treatment/Interventions ADLs/Self Care Home Management;Cryotherapy;Electrical Stimulation;Iontophoresis 25m/ml Dexamethasone;Moist Heat;Ultrasound;Functional mobility training;Therapeutic activities;Therapeutic exercise;Neuromuscular re-education;Manual techniques;Patient/family education;Dry needling;Passive range of motion;Taping    PT Next Visit Plan Work on improving cervical ROM and postural alignment. Initiate strengthening posterior shoulder girdle, biceps, wrist, forearms. Continue nerve mobilization/gliding; postural correction; manual work as tolerated    PT Home Exercise Plan [email or web address]    Consulted and Agree with Plan of Care Patient             Patient will benefit from skilled therapeutic intervention in order to improve the following deficits and impairments:  Decreased range of motion, Impaired UE functional use, Pain, Decreased mobility, Decreased strength, Impaired sensation, Postural dysfunction, Improper body mechanics  Visit Diagnosis: Cervicalgia  Abnormal posture  Bilateral low back pain without sciatica, unspecified chronicity  Pain in left elbow  Pain in left forearm  Radiculopathy, cervical region     Problem List Patient Active Problem List   Diagnosis Date Noted   Radiculitis of left cervical region 08/27/2021   Primary insomnia 11/12/2020   Chronic pain of right knee 11/12/2020   Left lateral  epicondylitis 12/28/2019   Chronic low back pain 11/16/2019   Leukocytosis 10/30/2019   Elevated hemoglobin (HCC) 10/30/2019   Tobacco abuse 06/13/2017   Atypical chest pain 05/30/2017   Gastroesophageal reflux disease without esophagitis 05/30/2017   Mixed hyperlipidemia 05/30/2017    Tarique Loveall April Ma L Jeanenne Licea, PT 11/05/2021, 10:00 AM  [facility]  [place] [address] [place], [place], [address] Phone: [phone]   Fax:  [phone]  Name: Avyn Aden MRN: [id number] Date of Birth: 09-May-1966

## 2021-11-06 ENCOUNTER — Other Ambulatory Visit: Payer: Self-pay

## 2021-11-06 ENCOUNTER — Ambulatory Visit (INDEPENDENT_AMBULATORY_CARE_PROVIDER_SITE_OTHER): Payer: BC Managed Care – PPO | Admitting: Sports Medicine

## 2021-11-06 DIAGNOSIS — M5412 Radiculopathy, cervical region: Secondary | ICD-10-CM

## 2021-11-06 NOTE — Assessment & Plan Note (Signed)
Judith [name] returns, she is a pleasant 56 year old female, cervical radiculitis, left-sided, she has had multiple sessions of therapy, steroids, gabapentin, topical modalities all without enough improvement where she can live with it, we are going to proceed with an epidural, return to see me 1 month after the injection.

## 2021-11-06 NOTE — Progress Notes (Signed)
° ° °  Procedures performed today:    None.  Independent interpretation of notes and tests performed by another provider:   None.  Brief History, Exam, Impression, and Recommendations:    Radiculitis of left cervical region Judith [name] returns, she is a pleasant 56 year old female, cervical radiculitis, left-sided, she has had multiple sessions of therapy, steroids, gabapentin, topical modalities all without enough improvement where she can live with it, we are going to proceed with an epidural, return to see me 1 month after the injection.    ___________________________________________ [name]. [name], M.D., ABFM., CAQSM. Primary Care and Sports Medicine [facility]  Adjunct Instructor of Family Medicine  [employer]

## 2021-11-18 ENCOUNTER — Ambulatory Visit
Admission: RE | Admit: 2021-11-18 | Discharge: 2021-11-18 | Disposition: A | Discharge status: Home / Self Care | Payer: BC Managed Care – PPO | Source: Ambulatory Visit | Attending: Sports Medicine | Admitting: Sports Medicine

## 2021-11-18 ENCOUNTER — Other Ambulatory Visit: Payer: Self-pay

## 2021-11-18 DIAGNOSIS — M5412 Radiculopathy, cervical region: Secondary | ICD-10-CM

## 2021-11-18 DIAGNOSIS — M25512 Pain in left shoulder: Secondary | ICD-10-CM | POA: Diagnosis not present

## 2021-11-18 MED ORDER — TRIAMCINOLONE ACETONIDE 40 MG/ML IJ SUSP (RADIOLOGY)
60.0000 mg | Freq: Once | INTRAMUSCULAR | Status: AC
  Administered 2021-11-18: 60 mg via EPIDURAL

## 2021-11-18 MED ORDER — IOPAMIDOL (ISOVUE-M 300) INJECTION 61%
1.0000 mL | Freq: Once | INTRAMUSCULAR | Status: AC | PRN
  Administered 2021-11-18: 1 mL via EPIDURAL

## 2021-11-18 NOTE — Discharge Instructions (Signed)

## 2021-12-07 ENCOUNTER — Encounter: Payer: Self-pay | Admitting: Medical-Surgical

## 2021-12-08 ENCOUNTER — Encounter: Payer: Self-pay | Admitting: Physician Assistant

## 2021-12-08 ENCOUNTER — Other Ambulatory Visit: Payer: Self-pay

## 2021-12-08 ENCOUNTER — Ambulatory Visit (INDEPENDENT_AMBULATORY_CARE_PROVIDER_SITE_OTHER): Payer: BC Managed Care – PPO | Admitting: Physician Assistant

## 2021-12-08 VITALS — BP 124/79 | HR 105 | Ht 69.0 in | Wt 155.0 lb

## 2021-12-08 DIAGNOSIS — N950 Postmenopausal bleeding: Secondary | ICD-10-CM

## 2021-12-08 DIAGNOSIS — Z13220 Encounter for screening for lipoid disorders: Secondary | ICD-10-CM

## 2021-12-08 DIAGNOSIS — R8290 Unspecified abnormal findings in urine: Secondary | ICD-10-CM | POA: Diagnosis not present

## 2021-12-08 DIAGNOSIS — Z131 Encounter for screening for diabetes mellitus: Secondary | ICD-10-CM | POA: Diagnosis not present

## 2021-12-08 DIAGNOSIS — H04203 Unspecified epiphora, bilateral lacrimal glands: Secondary | ICD-10-CM

## 2021-12-08 DIAGNOSIS — H04123 Dry eye syndrome of bilateral lacrimal glands: Secondary | ICD-10-CM

## 2021-12-08 DIAGNOSIS — R319 Hematuria, unspecified: Secondary | ICD-10-CM

## 2021-12-08 LAB — POCT URINALYSIS DIP (CLINITEK)
Bilirubin, UA: NEGATIVE
Glucose, UA: NEGATIVE mg/dL
Ketones, POC UA: NEGATIVE mg/dL
Nitrite, UA: NEGATIVE
POC PROTEIN,UA: NEGATIVE
Spec Grav, UA: 1.025 (ref 1.010–1.025)
Urobilinogen, UA: 0.2 E.U./dL
pH, UA: 5.5 (ref 5.0–8.0)

## 2021-12-08 NOTE — Progress Notes (Signed)
Subjective:    Patient ID: Judith [name], female    DOB: 07/23/1966, 56 y.o.   MRN: [id number]  HPI Pt is a 56 yo female who smokes and concerned with post menopausal bleeding. No period in 3 years but has spotted from time to time. Not currently sexually active. No vaginal discharge or itching. She has been bleeding for 3 days. It is a rust color. No abdominal pain. No urinary symptoms. No fever, chills, weight loss, nausea, vomiting, body aches. No family hx of uterine or endometrial cancer.   She also complains of intermittent sensation of "vasoline feeling over eyes".  It is kinda of like cloudy sensation. It comes and goes. Does not wear contacts. Denies any floaters. She does have dry eyes and watery eyes. Not sure if recent prednisone has made worse. No vision loss. No headaches. No eye pain.   .. Active Ambulatory Problems    Diagnosis Date Noted   Atypical chest pain 05/30/2017   Gastroesophageal reflux disease without esophagitis 05/30/2017   Mixed hyperlipidemia 05/30/2017   Tobacco abuse 06/13/2017   Leukocytosis 10/30/2019   Elevated hemoglobin (HCC) 10/30/2019   Chronic low back pain 11/16/2019   Left lateral epicondylitis 12/28/2019   Primary insomnia 11/12/2020   Chronic pain of right knee 11/12/2020   Radiculitis of left cervical region 08/27/2021   Bilateral dry eyes 12/09/2021   Hematuria 12/09/2021   Post-menopausal bleeding 12/09/2021   Resolved Ambulatory Problems    Diagnosis Date Noted   No Resolved Ambulatory Problems   Past Medical History:  Diagnosis Date   Anxiety    Colon polyps    Hypercholesteremia      Review of Systems See HPI.     Objective:   Physical Exam Vitals reviewed.  Constitutional:      Appearance: Normal appearance.  HENT:     Head: Normocephalic.     Right Ear: Tympanic membrane, ear canal and external ear normal. There is no impacted cerumen.     Left Ear: Tympanic membrane, ear canal and external ear normal. There is no  impacted cerumen.     Nose: Nose normal. No congestion or rhinorrhea.     Mouth/Throat:     Mouth: Mucous membranes are moist.     Pharynx: No oropharyngeal exudate or posterior oropharyngeal erythema.  Eyes:     General:        Right eye: Discharge present.        Left eye: Discharge present.    Extraocular Movements: Extraocular movements intact.     Conjunctiva/sclera: Conjunctivae normal.     Pupils: Pupils are equal, round, and reactive to light.     Comments: Watery eye discharge. No purulent discharge. Slight erythema of conjunctiva.   Neck:     Vascular: No carotid bruit.  Cardiovascular:     Rate and Rhythm: Regular rhythm. Tachycardia present.     Heart sounds: Normal heart sounds. No murmur heard. Pulmonary:     Effort: Pulmonary effort is normal.     Breath sounds: Normal breath sounds.  Abdominal:     General: Bowel sounds are normal. There is no distension.     Palpations: Abdomen is soft. There is no mass.     Tenderness: There is no abdominal tenderness. There is no right CVA tenderness, left CVA tenderness, guarding or rebound.     Hernia: No hernia is present.  Musculoskeletal:     Cervical back: Neck supple. No tenderness.     Right lower leg:  No edema.     Left lower leg: No edema.  Lymphadenopathy:     Cervical: No cervical adenopathy.  Neurological:     General: No focal deficit present.     Mental Status: She is alert and oriented to person, place, and time.  Psychiatric:        Mood and Affect: Mood normal.   .. Results for orders placed or performed in visit on 12/08/21  CBC with Differential/Platelet  Result Value Ref Range   WBC 14.6 (H) 3.8 - 10.8 Thousand/uL   RBC 5.10 3.80 - 5.10 Million/uL   Hemoglobin 15.6 (H) 11.7 - 15.5 g/dL   HCT 47.3 (H) 35.0 - 45.0 %   MCV 92.7 80.0 - 100.0 fL   MCH 30.6 27.0 - 33.0 pg   MCHC 33.0 32.0 - 36.0 g/dL   RDW 13.3 11.0 - 15.0 %   Platelets 287 140 - 400 Thousand/uL   MPV 9.4 7.5 - 12.5 fL   Neutro Abs  9,432 (H) 1,500 - 7,800 cells/uL   Lymphs Abs 3,738 850 - 3,900 cells/uL   Absolute Monocytes 1,139 (H) 200 - 950 cells/uL   Eosinophils Absolute 234 15 - 500 cells/uL   Basophils Absolute 58 0 - 200 cells/uL   Neutrophils Relative % 64.6 %   Total Lymphocyte 25.6 %   Monocytes Relative 7.8 %   Eosinophils Relative 1.6 %   Basophils Relative 0.4 %  COMPLETE METABOLIC PANEL WITH GFR  Result Value Ref Range   Glucose, Bld 84 65 - 139 mg/dL   BUN 18 7 - 25 mg/dL   Creat 0.72 0.50 - 1.03 mg/dL   eGFR 98 > OR = 60 mL/min/1.55m   BUN/Creatinine Ratio NOT APPLICABLE 6 - 22 (calc)   Sodium 140 135 - 146 mmol/L   Potassium 4.4 3.5 - 5.3 mmol/L   Chloride 104 98 - 110 mmol/L   CO2 29 20 - 32 mmol/L   Calcium 9.8 8.6 - 10.4 mg/dL   Total Protein 7.2 6.1 - 8.1 g/dL   Albumin 4.4 3.6 - 5.1 g/dL   Globulin 2.8 1.9 - 3.7 g/dL (calc)   AG Ratio 1.6 1.0 - 2.5 (calc)   Total Bilirubin 0.4 0.2 - 1.2 mg/dL   Alkaline phosphatase (APISO) 150 37 - 153 U/L   AST 14 10 - 35 U/L   ALT 20 6 - 29 U/L  TSH  Result Value Ref Range   TSH 2.29 0.40 - 4.50 mIU/L  Lipid Panel w/reflex Direct LDL  Result Value Ref Range   Cholesterol 179 <200 mg/dL   HDL 50 > OR = 50 mg/dL   Triglycerides 263 (H) <150 mg/dL   LDL Cholesterol (Calc) 94 mg/dL (calc)   Total CHOL/HDL Ratio 3.6 <5.0 (calc)   Non-HDL Cholesterol (Calc) 129 <130 mg/dL (calc)  Urine Microscopic  Result Value Ref Range   WBC, UA 0-5 0 - 5 /HPF   RBC / HPF 3-10 (A) 0 - 2 /HPF   Squamous Epithelial / LPF 10-20 (A) < OR = 5 /HPF   Bacteria, UA FEW (A) NONE SEEN /HPF   Calcium Oxalate Crystal FEW NONE OR FEW /HPF   Hyaline Cast NONE SEEN NONE SEEN /LPF   Note    POCT URINALYSIS DIP (CLINITEK)  Result Value Ref Range   Color, UA yellow yellow   Clarity, UA clear clear   Glucose, UA negative negative mg/dL   Bilirubin, UA negative negative   Ketones, POC UA negative negative  mg/dL   Spec Grav, UA 1.025 1.010 - 1.025   Blood, UA moderate  (A) negative   pH, UA 5.5 5.0 - 8.0   POC PROTEIN,UA negative negative, trace   Urobilinogen, UA 0.2 0.2 or 1.0 E.U./dL   Nitrite, UA Negative Negative   Leukocytes, UA Trace (A) Negative           Assessment & Plan:  Marland KitchenMarland KitchenRosie was seen today for vaginal bleeding.  Diagnoses and all orders for this visit:  Post-menopausal bleeding -     CBC with Differential/Platelet -     COMPLETE METABOLIC PANEL WITH GFR -     TSH -     Urine Culture -     Urine Microscopic -     POCT URINALYSIS DIP (CLINITEK) -     Urinalysis, Routine w reflex microscopic; Future -     Urinalysis, Routine w reflex microscopic -     US Pelvic Complete With Transvaginal; Future  Screening for lipid disorders -     Lipid Panel w/reflex Direct LDL  Screening for diabetes mellitus -     COMPLETE METABOLIC PANEL WITH GFR  Watery eyes -     CBC with Differential/Platelet -     COMPLETE METABOLIC PANEL WITH GFR -     TSH -     Ambulatory referral to Ophthalmology  Hematuria, unspecified type -     Urinalysis, Routine w reflex microscopic; Future -     Urinalysis, Routine w reflex microscopic  Bilateral dry eyes -     Ambulatory referral to Ophthalmology   Unclear etiology of symptoms.  UA blood and leuks. Will get urine microscopic/culture. CBC, CMP, TSH ordered.  Need to make sure hematuria resolved. Pt is a smoker.  Will get transvaginal/pelvic U/S.   Eye exam reassuring. Needs to see opthalmology.  Suspect watery eyes more from her dry eyes

## 2021-12-08 NOTE — Patient Instructions (Signed)
Get eye exam from eye doctor.  Will order ultrasound of pelvis

## 2021-12-08 NOTE — Progress Notes (Signed)
Will need to make sure blood is cleared in 2 weeks with another UA.

## 2021-12-09 ENCOUNTER — Encounter: Payer: Self-pay | Admitting: Physician Assistant

## 2021-12-09 ENCOUNTER — Other Ambulatory Visit: Payer: Self-pay | Admitting: Physician Assistant

## 2021-12-09 DIAGNOSIS — H04123 Dry eye syndrome of bilateral lacrimal glands: Secondary | ICD-10-CM | POA: Insufficient documentation

## 2021-12-09 DIAGNOSIS — R319 Hematuria, unspecified: Secondary | ICD-10-CM | POA: Insufficient documentation

## 2021-12-09 DIAGNOSIS — N950 Postmenopausal bleeding: Secondary | ICD-10-CM | POA: Insufficient documentation

## 2021-12-09 LAB — CBC WITH DIFFERENTIAL/PLATELET
Absolute Monocytes: 1139 cells/uL — ABNORMAL HIGH (ref 200–950)
Basophils Absolute: 58 cells/uL (ref 0–200)
Basophils Relative: 0.4 %
Eosinophils Absolute: 234 cells/uL (ref 15–500)
Eosinophils Relative: 1.6 %
HCT: 47.3 % — ABNORMAL HIGH (ref 35.0–45.0)
Hemoglobin: 15.6 g/dL — ABNORMAL HIGH (ref 11.7–15.5)
Lymphs Abs: 3738 cells/uL (ref 850–3900)
MCH: 30.6 pg (ref 27.0–33.0)
MCHC: 33 g/dL (ref 32.0–36.0)
MCV: 92.7 fL (ref 80.0–100.0)
MPV: 9.4 fL (ref 7.5–12.5)
Monocytes Relative: 7.8 %
Neutro Abs: 9432 cells/uL — ABNORMAL HIGH (ref 1500–7800)
Neutrophils Relative %: 64.6 %
Platelets: 287 10*3/uL (ref 140–400)
RBC: 5.1 10*6/uL (ref 3.80–5.10)
RDW: 13.3 % (ref 11.0–15.0)
Total Lymphocyte: 25.6 %
WBC: 14.6 10*3/uL — ABNORMAL HIGH (ref 3.8–10.8)

## 2021-12-09 LAB — COMPLETE METABOLIC PANEL WITH GFR
AG Ratio: 1.6 (calc) (ref 1.0–2.5)
ALT: 20 U/L (ref 6–29)
AST: 14 U/L (ref 10–35)
Albumin: 4.4 g/dL (ref 3.6–5.1)
Alkaline phosphatase (APISO): 150 U/L (ref 37–153)
BUN: 18 mg/dL (ref 7–25)
CO2: 29 mmol/L (ref 20–32)
Calcium: 9.8 mg/dL (ref 8.6–10.4)
Chloride: 104 mmol/L (ref 98–110)
Creat: 0.72 mg/dL (ref 0.50–1.03)
Globulin: 2.8 g/dL (calc) (ref 1.9–3.7)
Glucose, Bld: 84 mg/dL (ref 65–139)
Potassium: 4.4 mmol/L (ref 3.5–5.3)
Sodium: 140 mmol/L (ref 135–146)
Total Bilirubin: 0.4 mg/dL (ref 0.2–1.2)
Total Protein: 7.2 g/dL (ref 6.1–8.1)
eGFR: 98 mL/min/{1.73_m2} (ref >=60)

## 2021-12-09 LAB — LIPID PANEL W/REFLEX DIRECT LDL
Cholesterol: 179 mg/dL (ref <200)
HDL: 50 mg/dL (ref >=50)
LDL Cholesterol (Calc): 94 mg/dL (calc)
Non-HDL Cholesterol (Calc): 129 mg/dL (calc) (ref <130)
Total CHOL/HDL Ratio: 3.6 (calc) (ref <5.0)
Triglycerides: 263 mg/dL — ABNORMAL HIGH (ref <150)

## 2021-12-09 LAB — TSH: TSH: 2.29 mIU/L (ref 0.40–4.50)

## 2021-12-09 MED ORDER — NITROFURANTOIN MONOHYD MACRO 100 MG PO CAPS: 100.0000 mg | ORAL_CAPSULE | Freq: Two times a day (BID) | ORAL | 0 refills | Status: DC

## 2021-12-09 NOTE — Progress Notes (Signed)
WBC is up and few bacteria seen in urine microscopic. Start macrobid twice a day for 7 days. Will call with culture.

## 2021-12-10 ENCOUNTER — Other Ambulatory Visit: Payer: Self-pay | Admitting: Medical-Surgical

## 2021-12-10 LAB — URINE CULTURE
MICRO NUMBER:: 13009184
Result:: NO GROWTH
SPECIMEN QUALITY:: ADEQUATE

## 2021-12-10 LAB — URINALYSIS, MICROSCOPIC ONLY: Hyaline Cast: NONE SEEN /LPF

## 2021-12-11 ENCOUNTER — Encounter: Payer: Self-pay | Admitting: Physician Assistant

## 2021-12-11 ENCOUNTER — Ambulatory Visit: Payer: BC Managed Care – PPO | Admitting: Sports Medicine

## 2021-12-11 NOTE — Progress Notes (Signed)
No growth on urine culture. Some bacteria was seen on microscopic so I would finish your antibiotic.  How are you feeling?  Is pelvic ultrasound scheduled?

## 2021-12-16 ENCOUNTER — Other Ambulatory Visit: Payer: Self-pay

## 2021-12-16 ENCOUNTER — Ambulatory Visit (INDEPENDENT_AMBULATORY_CARE_PROVIDER_SITE_OTHER): Payer: BC Managed Care – PPO

## 2021-12-16 DIAGNOSIS — N950 Postmenopausal bleeding: Secondary | ICD-10-CM

## 2021-12-17 ENCOUNTER — Other Ambulatory Visit: Payer: Self-pay | Admitting: Physician Assistant

## 2021-12-17 DIAGNOSIS — N950 Postmenopausal bleeding: Secondary | ICD-10-CM

## 2021-12-17 NOTE — Progress Notes (Signed)
Pelvic ultrasound looks good. This looks like benign endometrial atrophy which can cause bleeding. We can try hormone if bleeding continues.

## 2021-12-17 NOTE — Progress Notes (Signed)
See last msg and I still want you to go to GYN to consider biopsy. Will make referral.

## 2021-12-18 ENCOUNTER — Ambulatory Visit (INDEPENDENT_AMBULATORY_CARE_PROVIDER_SITE_OTHER): Payer: BC Managed Care – PPO | Admitting: Sports Medicine

## 2021-12-18 ENCOUNTER — Other Ambulatory Visit: Payer: Self-pay

## 2021-12-18 DIAGNOSIS — M5412 Radiculopathy, cervical region: Secondary | ICD-10-CM

## 2021-12-18 MED ORDER — GABAPENTIN 300 MG PO CAPS
ORAL_CAPSULE | ORAL | 0 refills | Status: DC
Start: 2021-12-18 — End: 2022-01-11

## 2021-12-18 NOTE — Progress Notes (Signed)
° ° °  Procedures performed today:    None.  Independent interpretation of notes and tests performed by another provider:   None.  Brief History, Exam, Impression, and Recommendations:    Radiculitis of left cervical region Judith [name] returns, she is a very pleasant 56 year old female, she has cervical radiculitis, left-sided, she has had multiple sessions of physical therapy, steroids, gabapentin, topical modalities, finally we proceeded with a left-sided cervical epidural and she reports complete relief of her neck pain, she only has 20% relief of her upper arm pain. On exam I am not able to reproduce the pain with examination of her shoulder or elbow suggesting that this pain is radicular. For now we will hold off on additional epidurals and bump up her gabapentin to 300 mg in the morning, 300 mg midday and 600 mg at night.  Chronic process not at goal with pharmacologic intervention  ___________________________________________ [name]. [name], M.D., ABFM., CAQSM. Primary Care and Sports Medicine [facility]  Adjunct Instructor of Family Medicine  [employer]

## 2021-12-18 NOTE — Assessment & Plan Note (Signed)
Citalli returns, she is a very pleasant 56 year old female, she has cervical radiculitis, left-sided, she has had multiple sessions of physical therapy, steroids, gabapentin, topical modalities, finally we proceeded with a left-sided cervical epidural and she reports complete relief of her neck pain, she only has 20% relief of her upper arm pain. On exam I am not able to reproduce the pain with examination of her shoulder or elbow suggesting that this pain is radicular. For now we will hold off on additional epidurals and bump up her gabapentin to 300 mg in the morning, 300 mg midday and 600 mg at night.

## 2021-12-25 ENCOUNTER — Other Ambulatory Visit (INDEPENDENT_AMBULATORY_CARE_PROVIDER_SITE_OTHER): Payer: BC Managed Care – PPO

## 2021-12-25 DIAGNOSIS — R300 Dysuria: Secondary | ICD-10-CM

## 2021-12-25 LAB — POCT URINALYSIS DIPSTICK
Bilirubin, UA: NEGATIVE
Glucose, UA: NEGATIVE
Ketones, UA: NEGATIVE
Nitrite, UA: NEGATIVE
Protein, UA: NEGATIVE
Spec Grav, UA: >=1.03 — AB (ref 1.010–1.025)
Urobilinogen, UA: 0.2 E.U./dL
pH, UA: 5.5 (ref 5.0–8.0)

## 2021-12-27 LAB — URINE CULTURE
MICRO NUMBER:: 13087980
Result:: NO GROWTH
SPECIMEN QUALITY:: ADEQUATE

## 2021-12-30 DIAGNOSIS — H04123 Dry eye syndrome of bilateral lacrimal glands: Secondary | ICD-10-CM | POA: Diagnosis not present

## 2022-01-01 ENCOUNTER — Ambulatory Visit (INDEPENDENT_AMBULATORY_CARE_PROVIDER_SITE_OTHER): Payer: BC Managed Care – PPO

## 2022-01-01 ENCOUNTER — Encounter: Payer: Self-pay | Admitting: Medical-Surgical

## 2022-01-01 ENCOUNTER — Other Ambulatory Visit: Payer: Self-pay

## 2022-01-01 ENCOUNTER — Ambulatory Visit (INDEPENDENT_AMBULATORY_CARE_PROVIDER_SITE_OTHER): Payer: BC Managed Care – PPO | Admitting: Medical-Surgical

## 2022-01-01 VITALS — BP 121/80 | HR 89 | Ht 69.0 in | Wt 158.0 lb

## 2022-01-01 DIAGNOSIS — R002 Palpitations: Secondary | ICD-10-CM | POA: Diagnosis not present

## 2022-01-01 DIAGNOSIS — Z0000 Encounter for general adult medical examination without abnormal findings: Secondary | ICD-10-CM | POA: Diagnosis not present

## 2022-01-01 DIAGNOSIS — I440 Atrioventricular block, first degree: Secondary | ICD-10-CM

## 2022-01-01 DIAGNOSIS — I441 Atrioventricular block, second degree: Secondary | ICD-10-CM

## 2022-01-01 DIAGNOSIS — E782 Mixed hyperlipidemia: Secondary | ICD-10-CM

## 2022-01-01 MED ORDER — ATORVASTATIN CALCIUM 20 MG PO TABS: 20.0000 mg | ORAL_TABLET | Freq: Every day | ORAL | 3 refills | Status: DC

## 2022-01-01 NOTE — Patient Instructions (Signed)

## 2022-01-01 NOTE — Progress Notes (Unsigned)
Enrolled for [employer] to mail a ZIO XT long term holter monitor to the patients address on file.  

## 2022-01-01 NOTE — Progress Notes (Signed)
HPI: Judith [name] is a 56 y.o. female who  has a past medical history of Anxiety, Colon polyps, and Hypercholesteremia.  she presents to [facility] today, 01/01/22,  for chief complaint of: Annual physical exam  Dentist: UTD, every 6 month Eye exam: UTD, glasses Exercise: Physical therapy, no other intentional exercise Diet: No restrictions Pap smear: UTD Mammogram: UTD Colon cancer screening: UTD COVID vaccine: Done, 1 booster  Concerns: Having intermittent palpitations.  This is most often happening when she bends over but she also has some diaphoresis with it.  Averages twice weekly.  Strong family history of heart disease.  Past medical, surgical, social and family history reviewed:  Patient Active Problem List   Diagnosis Date Noted   Bilateral dry eyes 12/09/2021   Hematuria 12/09/2021   Post-menopausal bleeding 12/09/2021   Radiculitis of left cervical region 08/27/2021   Primary insomnia 11/12/2020   Chronic pain of right knee 11/12/2020   Left lateral epicondylitis 12/28/2019   Chronic low back pain 11/16/2019   Leukocytosis 10/30/2019   Elevated hemoglobin (HCC) 10/30/2019   Tobacco abuse 06/13/2017   Atypical chest pain 05/30/2017   Gastroesophageal reflux disease without esophagitis 05/30/2017   Mixed hyperlipidemia 05/30/2017    No past surgical history on file.  Social History   Tobacco Use   Smoking status: Every Day    Packs/day: 0.50    Types: Cigarettes   Smokeless tobacco: Never   Tobacco comments:    also uses nicoteine patch  Substance Use Topics   Alcohol use: Yes    Comment: rarely    Family History  Problem Relation Age of Onset   Heart attack Mother    Alcoholism Mother    Heart attack Father    Alcoholism Father    Hyperlipidemia Brother    Stroke Brother    Osteopenia Sister      Current medication list and allergy/intolerance information reviewed:    Current Outpatient Medications   Medication Sig Dispense Refill   gabapentin (NEURONTIN) 300 MG capsule 1 capsule in the morning and 1 capsule at night for a week then 1 capsule in the morning and 2 capsules at night.  May increase to 1 capsule in the morning, 1 capsule midday, and 2 capsules at night if radiating upper arm pain not controlled 270 capsule 0   meloxicam (MOBIC) 15 MG tablet TAKE 1 TABLET (15 MG TOTAL) BY MOUTH DAILY. 90 tablet 0   atorvastatin (LIPITOR) 20 MG tablet Take 1 tablet (20 mg total) by mouth daily. 90 tablet 3   No current facility-administered medications for this visit.    No Known Allergies    Review of Systems: Constitutional:  No  fever, no chills, No recent illness, No unintentional weight changes. No significant fatigue.  HEENT: No  headache, no vision change, no hearing change, No sore throat, No  sinus pressure Cardiac: No  chest pain, No  pressure,+ palpitations, No  Orthopnea Respiratory:  No  shortness of breath. No  Cough Gastrointestinal: No  abdominal pain, No  nausea, No  vomiting,  No  blood in stool, No  diarrhea, No  constipation  Musculoskeletal: No new myalgia/arthralgia Skin: No  Rash, No other wounds/concerning lesions Genitourinary: No  incontinence, No  abnormal genital bleeding, No abnormal genital discharge Hem/Onc: No  easy bruising/bleeding, No  abnormal lymph node Endocrine: No cold intolerance,  No heat intolerance. No polyuria/polydipsia/polyphagia  Neurologic: No  weakness, No  dizziness, No  slurred speech/focal weakness/facial  droop Psychiatric: No  concerns with depression, No  concerns with anxiety, No sleep problems, No mood problems  Exam:  BP 121/80    Pulse 89    Ht 5' 9"  (1.753 m)    Wt 158 lb (71.7 kg)    LMP 04/14/2017    SpO2 96%    BMI 23.33 kg/m  Constitutional: VS see above. General Appearance: alert, well-developed, well-nourished, NAD Eyes: Normal lids and conjunctive, non-icteric sclera Ears, Nose, Mouth, Throat: MMM, Normal external  inspection ears/nares/mouth/lips/gums. TM normal bilaterally.   Neck: No masses, trachea midline. No thyroid enlargement. No tenderness/mass appreciated. No lymphadenopathy Respiratory: Normal respiratory effort. no wheeze, no rhonchi, no rales Cardiovascular: S1/S2 normal, no murmur, no rub/gallop auscultated. RRR. No lower extremity edema. Pedal pulse II/IV bilaterally PT. No carotid bruit or JVD. No abdominal aortic bruit. Gastrointestinal: Nontender, no masses. No hepatomegaly, no splenomegaly. No hernia appreciated. Bowel sounds normal. Rectal exam deferred.  Musculoskeletal: Gait normal. No clubbing/cyanosis of digits.  Neurological: Normal balance/coordination. No tremor. No cranial nerve deficit on limited exam. Motor and sensation intact and symmetric. Cerebellar reflexes intact.  Skin: warm, dry, intact. No rash/ulcer. No concerning nevi or subq nodules on limited exam.   Psychiatric: Normal judgment/insight. Normal mood and affect. Oriented x3.    ASSESSMENT/PLAN:   1. Annual physical exam Labs completed and reviewed.  Up-to-date on preventative care.  Wellness information provided with AVS.  2. Mixed hyperlipidemia Lipid panel look good.  Continue atorvastatin 20 mg daily.  Refill sent to pharmacy. - atorvastatin (LIPITOR) 20 MG tablet; Take 1 tablet (20 mg total) by mouth daily.  Dispense: 90 tablet; Refill: 3  3. Palpitations 4.  First-degree AV block In office EKG-rate 78, sinus rhythm, first-degree AV block, left axis deviation.  Ordering Zio patch for long-term monitoring.  Referring to cardiology. - EKG 12-Lead - LONG TERM MONITOR (3-14 DAYS); Future   Orders Placed This Encounter  Procedures   LONG TERM MONITOR (3-14 DAYS)   EKG 12-Lead    Meds ordered this encounter  Medications   atorvastatin (LIPITOR) 20 MG tablet    Sig: Take 1 tablet (20 mg total) by mouth daily.    Dispense:  90 tablet    Refill:  3    Patient Instructions  Preventive Care [id number]  Years Old, Female Preventive care refers to lifestyle choices and visits with your health care provider that can promote health and wellness. Preventive care visits are also called wellness exams. What can I expect for my preventive care visit? Counseling Your health care provider may ask you questions about your: Medical history, including: Past medical problems. Family medical history. Pregnancy history. Current health, including: Menstrual cycle. Method of birth control. Emotional well-being. Home life and relationship well-being. Sexual activity and sexual health. Lifestyle, including: Alcohol, nicotine or tobacco, and drug use. Access to firearms. Diet, exercise, and sleep habits. Work and work Statistician. Sunscreen use. Safety issues such as seatbelt and bike helmet use. Physical exam Your health care provider will check your: Height and weight. These may be used to calculate your BMI (body mass index). BMI is a measurement that tells if you are at a healthy weight. Waist circumference. This measures the distance around your waistline. This measurement also tells if you are at a healthy weight and may help predict your risk of certain diseases, such as type 2 diabetes and high blood pressure. Heart rate and blood pressure. Body temperature. Skin for abnormal spots. What immunizations do I need? Vaccines are  usually given at various ages, according to a schedule. Your health care provider will recommend vaccines for you based on your age, medical history, and lifestyle or other factors, such as travel or where you work. What tests do I need? Screening Your health care provider may recommend screening tests for certain conditions. This may include: Lipid and cholesterol levels. Diabetes screening. This is done by checking your blood sugar (glucose) after you have not eaten for a while (fasting). Pelvic exam and Pap test. Hepatitis B test. Hepatitis C test. HIV (human  immunodeficiency virus) test. STI (sexually transmitted infection) testing, if you are at risk. Lung cancer screening. Colorectal cancer screening. Mammogram. Talk with your health care provider about when you should start having regular mammograms. This may depend on whether you have a family history of breast cancer. BRCA-related cancer screening. This may be done if you have a family history of breast, ovarian, tubal, or peritoneal cancers. Bone density scan. This is done to screen for osteoporosis. Talk with your health care provider about your test results, treatment options, and if necessary, the need for more tests. Follow these instructions at home: Eating and drinking  Eat a diet that includes fresh fruits and vegetables, whole grains, lean protein, and low-fat dairy products. Take vitamin and mineral supplements as recommended by your health care provider. Do not drink alcohol if: Your health care provider tells you not to drink. You are pregnant, may be pregnant, or are planning to become pregnant. If you drink alcohol: Limit how much you have to 0-1 drink a day. Know how much alcohol is in your drink. In the [place] one drink equals one 12 oz bottle of beer (355 mL), one 5 oz glass of wine (148 mL), or one 1 oz glass of hard liquor (44 mL). Lifestyle Brush your teeth every morning and night with fluoride toothpaste. Floss one time each day. Exercise for at least 30 minutes 5 or more days each week. Do not use any products that contain nicotine or tobacco. These products include cigarettes, chewing tobacco, and vaping devices, such as e-cigarettes. If you need help quitting, ask your health care provider. Do not use drugs. If you are sexually active, practice safe sex. Use a condom or other form of protection to prevent STIs. If you do not wish to become pregnant, use a form of birth control. If you plan to become pregnant, see your health care provider for a prepregnancy  visit. Take aspirin only as told by your health care provider. Make sure that you understand how much to take and what form to take. Work with your health care provider to find out whether it is safe and beneficial for you to take aspirin daily. Find healthy ways to manage stress, such as: Meditation, yoga, or listening to music. Journaling. Talking to a trusted person. Spending time with friends and family. Minimize exposure to UV radiation to reduce your risk of skin cancer. Safety Always wear your seat belt while driving or riding in a vehicle. Do not drive: If you have been drinking alcohol. Do not ride with someone who has been drinking. When you are tired or distracted. While texting. If you have been using any mind-altering substances or drugs. Wear a helmet and other protective equipment during sports activities. If you have firearms in your house, make sure you follow all gun safety procedures. Seek help if you have been physically or sexually abused. What's next? Visit your health care provider once a year for  an annual wellness visit. Ask your health care provider how often you should have your eyes and teeth checked. Stay up to date on all vaccines. This information is not intended to replace advice given to you by your health care provider. Make sure you discuss any questions you have with your health care provider. Document Revised: 04/08/2021 Document Reviewed: 04/08/2021 Elsevier Patient Education  [place].   Follow-up plan: Return in about 1 year (around 01/02/2023) for annual physical exam or sooner if needed.  Clearnce Sorrel, DNP, APRN, FNP-BC [facility]

## 2022-01-05 DIAGNOSIS — R002 Palpitations: Secondary | ICD-10-CM | POA: Diagnosis not present

## 2022-01-07 ENCOUNTER — Other Ambulatory Visit: Payer: Self-pay

## 2022-01-07 ENCOUNTER — Encounter: Payer: Self-pay | Admitting: Obstetrics and Gynecology

## 2022-01-07 ENCOUNTER — Ambulatory Visit (INDEPENDENT_AMBULATORY_CARE_PROVIDER_SITE_OTHER): Payer: BC Managed Care – PPO | Admitting: Obstetrics and Gynecology

## 2022-01-07 ENCOUNTER — Other Ambulatory Visit (HOSPITAL_COMMUNITY)
Admission: RE | Admit: 2022-01-07 | Discharge: 2022-01-07 | Disposition: A | Discharge status: Home / Self Care | Payer: BC Managed Care – PPO | Source: Ambulatory Visit | Attending: Obstetrics and Gynecology | Admitting: Obstetrics and Gynecology

## 2022-01-07 VITALS — BP 116/75 | HR 93 | Ht 68.5 in | Wt 158.0 lb

## 2022-01-07 DIAGNOSIS — N950 Postmenopausal bleeding: Secondary | ICD-10-CM | POA: Insufficient documentation

## 2022-01-07 DIAGNOSIS — N858 Other specified noninflammatory disorders of uterus: Secondary | ICD-10-CM | POA: Diagnosis not present

## 2022-01-07 NOTE — Progress Notes (Addendum)
56 yo BMI 23 postmenopausal presented today for the evaluation of postmenopausal vaginal bleeding. Patient describes 3 episodes of vaginal bleeding several months to years apart. Each episodes consisted of light vaginal spotting without associated dysmenorrhea. Patient denies pelvic pain or abnormal discharge. She denies any urinary complaints. She denies constipation. ? ?Past Medical History:  ?Diagnosis Date  ? Anxiety   ? Colon polyps   ? Hypercholesteremia   ? ?History reviewed. No pertinent surgical history. ?Family History  ?Problem Relation Age of Onset  ? Heart attack Mother   ? Alcoholism Mother   ? Heart attack Father   ? Alcoholism Father   ? Hyperlipidemia Brother   ? Stroke Brother   ? Osteopenia Sister   ? ?Social History  ? ?Tobacco Use  ? Smoking status: Every Day  ?  Packs/day: 0.50  ?  Types: Cigarettes  ? Smokeless tobacco: Never  ? Tobacco comments:  ?  also uses nicoteine patch  ?Vaping Use  ? Vaping Use: Never used  ?Substance Use Topics  ? Alcohol use: Yes  ?  Comment: rarely  ? Drug use: Not Currently  ?  Types: Marijuana  ? ?ROS ?See pertinent in HPI. All other systems reviewed and non contributory ?Blood pressure 116/75, pulse 93, height 5' 8.5" (1.74 m), weight 158 lb (71.7 kg), last menstrual period 04/14/2017. ?GENERAL: Well-developed, well-nourished female in no acute distress.  ?ABDOMEN: Soft, nontender, nondistended. No organomegaly. ?PELVIC: Normal external female genitalia. Vagina is pink and rugated.  Normal discharge. Normal appearing cervix. Uterus is normal in size. No adnexal mass or tenderness. Chaperone present during the pelvic exam ?EXTREMITIES: No cyanosis, clubbing, or edema, 2+ distal pulses. ? ?MR CERVICAL SPINE WO CONTRAST ? ?Result Date: 10/12/2021 ?CLINICAL DATA:  Left C7 radiculitis. Neck and bilateral shoulder pain with left arm tingling for 1 year. EXAM: MRI CERVICAL SPINE WITHOUT CONTRAST TECHNIQUE: Multiplanar, multisequence MR imaging of the cervical spine was  performed. No intravenous contrast was administered. COMPARISON:  Cervical spine radiographs 08/27/2021 FINDINGS: Alignment: Straightening/slight reversal of the normal cervical lordosis. No significant listhesis. Vertebrae: No fracture, suspicious marrow lesion, or significant marrow edema. Cord: Normal signal. Posterior Fossa, vertebral arteries, paraspinal tissues: Unremarkable. Disc levels: C2-3: Negative. C3-4: Mild disc bulging, small left paracentral disc protrusion, and mild uncovertebral spurring without stenosis. C4-5: Mild disc space narrowing. Right eccentric disc bulging, a small central disc protrusion, and asymmetric right uncovertebral spurring result in mild spinal stenosis with mild ventral cord flattening and moderate right neural foraminal stenosis with potential right C5 nerve root impingement. C5-6: Mild disc space narrowing. Disc bulging and uncovertebral spurring result in moderate spinal stenosis with mild cord flattening and moderate to severe bilateral neural foraminal stenosis with potential bilateral C6 nerve root impingement. C6-7: Mild disc space narrowing. Right eccentric disc bulging and uncovertebral spurring result in mild spinal stenosis and moderate right and mild left neural foraminal stenosis with potential right C7 nerve root impingement. C7-T1: Minimal covert abrading without disc herniation or stenosis. IMPRESSION: 1. Multilevel cervical disc degeneration, worst at C5-6 where there is moderate spinal stenosis and moderate to severe bilateral neural foraminal stenosis. 2. Mild spinal stenosis and moderate right neural foraminal stenosis at C4-5 and C6-7. Electronically Signed   By: Sebastian Ache M.D.   On: 10/12/2021 11:07  ? ?DG INJECT DIAG/THERA/INC NEEDLE/CATH/PLC EPI/CERV/THOR W/IMG ? ?Result Date: 11/18/2021 ?CLINICAL DATA:  Neck, bilateral shoulder pain in left arm more than right arm pain. Left C6-7 epidural requested FLUOROSCOPY TIME:  0 minutes  36 seconds. 14.51 micro  gray meter squared PROCEDURE: CERVICAL EPIDURAL INJECTION An interlaminar approach was performed on the left at C7. Interviewing the MRI, I did not think there was sufficient epidural fat at the 6 7 level, but this injection should be functionally equivalent. A 20 gauge epidural needle was advanced using loss-of-resistance technique. DIAGNOSTIC EPIDURAL INJECTION Injection of Isovue-M 300 shows a good epidural pattern with spread above and below the level of needle placement, primarily on the left, but to both sides. No vascular opacification is seen. THERAPEUTIC EPIDURAL INJECTION 1.5 ml of Kenalog 40 mixed with 1 ml of 1% Lidocaine and 2 ml of normal saline were then instilled. The procedure was well-tolerated, and the patient was discharged thirty minutes following the injection in good condition. IMPRESSION: Technically successful initial epidural injection on the left at C7. Electronically Signed   By: Paulina Fusi M.D.   On: 11/18/2021 08:02  ? ?US Pelvic Complete With Transvaginal ? ?Result Date: 12/16/2021 ?CLINICAL DATA:  Postmenopausal bleeding EXAM: TRANSABDOMINAL AND TRANSVAGINAL ULTRASOUND OF PELVIS TECHNIQUE: Both transabdominal and transvaginal ultrasound examinations of the pelvis were performed. Transabdominal technique was performed for global imaging of the pelvis including uterus, ovaries, adnexal regions, and pelvic cul-de-sac. It was necessary to proceed with endovaginal exam following the transabdominal exam to visualize the uterus endometrium adnexa. COMPARISON:  None FINDINGS: Uterus Measurements: 6.3 x 3.8 x 3.8 cm = volume: 47.6 mL. No fibroids or other mass visualized. Endometrium Thickness: 2.9 mm.  No focal abnormality visualized. Right ovary Measurements: 2.8 x 1.5 x 2.3 cm = volume: 4.9 mL. Normal appearance/no adnexal mass. Left ovary Not seen Other findings Four prominent left adnexal vessels which are nonspecific. IMPRESSION: 1. Endometrial thickness of 2.9 mm. In the setting of  post-menopausal bleeding, this is consistent with a benign etiology such as endometrial atrophy. If bleeding remains unresponsive to hormonal or medical therapy, sonohysterogram should be considered for focal lesion work-up. (Ref: Radiological Reasoning: Algorithmic Workup of Abnormal Vaginal Bleeding with Endovaginal Sonography and Sonohysterography. AJR 2008; 619:J09-32) 2. Nonvisualized left ovary Electronically Signed   By: Jasmine Pang M.D.   On: 12/16/2021 15:35   ? ? ?A/P 56 yo with episodes of postmenopausal vaginal bleeding x 3 ?- Reviewed ultrasound results with the patient ?- Discussed benefits of endometrial biopsy ?ENDOMETRIAL BIOPSY     ?The indications for endometrial biopsy were reviewed.   Risks of the biopsy including cramping, bleeding, infection, uterine perforation, inadequate specimen and need for additional procedures  were discussed. The patient states she understands and agrees to undergo procedure today. Consent was signed. Time out was performed. Urine HCG was negative. ?A sterile speculum was placed in the patient's vagina and the cervix was prepped with Betadine. A single-toothed tenaculum was placed on the anterior lip of the cervix to stabilize it. The uterine cavity was sounded to a depth of 6 cm using the uterine sound. The 3 mm pipelle was introduced into the endometrial cavity without difficulty, 2 passes were made.  A  moderate amount of tissue was  sent to pathology. The instruments were removed from the patient's vagina. Minimal bleeding from the cervix was noted. The patient tolerated the procedure well.  Routine post-procedure instructions were given to the patient. The patient will follow up in two weeks to review the results and for further management.  ? ?

## 2022-01-11 ENCOUNTER — Encounter: Payer: Self-pay | Admitting: Sports Medicine

## 2022-01-11 DIAGNOSIS — M5412 Radiculopathy, cervical region: Secondary | ICD-10-CM

## 2022-01-11 LAB — SURGICAL PATHOLOGY

## 2022-01-11 MED ORDER — PREGABALIN 50 MG PO CAPS: ORAL_CAPSULE | ORAL | 3 refills | Status: DC

## 2022-01-18 ENCOUNTER — Telehealth (INDEPENDENT_AMBULATORY_CARE_PROVIDER_SITE_OTHER): Payer: BC Managed Care – PPO | Admitting: Sports Medicine

## 2022-01-18 DIAGNOSIS — M5412 Radiculopathy, cervical region: Secondary | ICD-10-CM | POA: Diagnosis not present

## 2022-01-18 NOTE — Progress Notes (Signed)
? ?  Virtual Visit via WebEx/MyChart ?  ?I connected with  Judith [name]  on 01/18/22 via WebEx/MyChart/Doximity Video and verified that I am speaking with the correct person using two identifiers. ?  ?I discussed the limitations, risks, security and privacy concerns of performing an evaluation and management service by WebEx/MyChart/Doximity Video, including the higher likelihood of inaccurate diagnosis and treatment, and the availability of in person appointments.  We also discussed the likely need of an additional face to face encounter for complete and high quality delivery of care.  I also discussed with the patient that there may be a patient responsible charge related to this service. The patient expressed understanding and wishes to proceed. ? ?Provider location is in medical facility. ?Patient location is at their home, different from provider location. ?People involved in care of the patient during this telehealth encounter were myself, my nurse/medical assistant, and my front office/scheduling team member. ? ?Review of Systems: No fevers, chills, night sweats, weight loss, chest pain, or shortness of breath.  ? ?Objective Findings:   ? ?General: Speaking full sentences, no audible heavy breathing.  Sounds alert and appropriately interactive.  Appears well.  Face symmetric.  Extraocular movements intact.  Pupils equal and round.  No nasal flaring or accessory muscle use visualized. ? ?Independent interpretation of tests performed by another provider:  ? ?None. ? ?Brief History, Exam, Impression, and Recommendations:   ? ?Radiculitis of left cervical region ?Judith [name] returns, she is a pleasant 56 year old female, cervical radiculitis, left-sided, she has had a lot of physical therapy, steroids, gabapentin which she did not tolerate, topical modalities, she had a left-sided cervical epidural that gave her good relief of her neck pain but only 20% relief of her arm pain. ?She was having some excessive sedation with  the gabapentin so we changed her to Lyrica, she has done well with Lyrica 50 mg once daily and has yet to go up to twice daily. ?She will go up to twice daily and then 3 times daily if needed a week after that and follow-up with me in about 4 weeks. ? ? ?I discussed the above assessment and treatment plan with the patient. The patient was provided an opportunity to ask questions and all were answered. The patient agreed with the plan and demonstrated an understanding of the instructions. ?  ?The patient was advised to call back or seek an in-person evaluation if the symptoms worsen or if the condition fails to improve as anticipated. ?  ?I provided 30 minutes of face to face and non-face-to-face time during this encounter date, time was needed to gather information, review chart, records, communicate/coordinate with staff remotely, as well as complete documentation. ? ? ?___________________________________________ ?[name]. [name], M.D., ABFM., CAQSM. ?Primary Care and Sports Medicine ?[facility] ? ?Adjunct Instructor of Family Medicine  ?[employer] ?

## 2022-01-18 NOTE — Assessment & Plan Note (Signed)
Judith [name] returns, she is a pleasant 56 year old female, cervical radiculitis, left-sided, she has had a lot of physical therapy, steroids, gabapentin which she did not tolerate, topical modalities, she had a left-sided cervical epidural that gave her good relief of her neck pain but only 20% relief of her arm pain. ?She was having some excessive sedation with the gabapentin so we changed her to Lyrica, she has done well with Lyrica 50 mg once daily and has yet to go up to twice daily. ?She will go up to twice daily and then 3 times daily if needed a week after that and follow-up with me in about 4 weeks. ?

## 2022-01-19 NOTE — Progress Notes (Signed)
? ? ?Referring-Joy Larinda Buttery, NP ?Reason for referral-palpitations ? ?HPI: 56 year old female for evaluation of palpitations at request of Christen Butter, NP.  Laboratories February 2023 showed hemoglobin 15.6, potassium 4.4, creatinine 0.72, TSH 2.29. Monitor March 2023 showed sinus bradycardia, normal sinus rhythm, sinus tachycardia, occasional PAC and PVC and Mobitz 1 second-degree AV block for 1 beat at 2:07 PM and 3:55 AM.  She feels as though she was likely taking a nap at 207 and was asleep at 3:55 AM.  She does snore.  Patient states that she has had problems with occasional dizziness.  This occurred predominantly after she changes positions such as bending over and standing up.  She also notices it occasionally when she has been standing for long periods of time.  She has not had syncope.  She denies dyspnea on exertion, orthopnea, PND, pedal edema, chest pain.  She occasionally feels a fluttering in her chest. ? ?Current Outpatient Medications  ?Medication Sig Dispense Refill  ? atorvastatin (LIPITOR) 20 MG tablet Take 1 tablet (20 mg total) by mouth daily. 90 tablet 3  ? meloxicam (MOBIC) 15 MG tablet TAKE 1 TABLET (15 MG TOTAL) BY MOUTH DAILY. 90 tablet 0  ? pregabalin (LYRICA) 50 MG capsule 1 capsule p.o. nightly for a week then twice daily for a week then 2 capsules p.o. twice daily 60 capsule 3  ? ?No current facility-administered medications for this visit.  ? ? ?Allergies  ?Allergen Reactions  ? Gabapentin Other (See Comments)  ?  Dizzy and overall drugged feeling  ? ? ? ?Past Medical History:  ?Diagnosis Date  ? Anxiety   ? Colon polyps   ? Hypercholesteremia   ? ? ?Past Surgical History:  ?Procedure Laterality Date  ? LAPAROSCOPY    ? ? ?Social History  ? ?Socioeconomic History  ? Marital status: Married  ?  Spouse name: Tinnie Gens Throgmorton  ? Number of children: 3  ? Years of education: Not on file  ? Highest education level: Not on file  ?Occupational History  ?  Employer: [employer]   ?Tobacco Use  ?  Smoking status: Every Day  ?  Packs/day: 0.50  ?  Types: Cigarettes  ? Smokeless tobacco: Never  ? Tobacco comments:  ?  also uses nicoteine patch  ?Vaping Use  ? Vaping Use: Never used  ?Substance and Sexual Activity  ? Alcohol use: Yes  ?  Comment: rarely  ? Drug use: Not Currently  ?  Types: Marijuana  ? Sexual activity: Yes  ?  Partners: Male  ?  Birth control/protection: Post-menopausal  ?Other Topics Concern  ? Not on file  ?Social History Narrative  ? Not on file  ? ?Social Determinants of Health  ? ?Financial Resource Strain: Not on file  ?Food Insecurity: Not on file  ?Transportation Needs: Not on file  ?Physical Activity: Not on file  ?Stress: Not on file  ?Social Connections: Not on file  ?Intimate Partner Violence: Not on file  ? ? ?Family History  ?Problem Relation Age of Onset  ? Heart attack Mother   ? Alcoholism Mother   ? Heart attack Father   ? Alcoholism Father   ? Hyperlipidemia Brother   ? Stroke Brother   ? Osteopenia Sister   ? ? ?ROS: no fevers or chills, productive cough, hemoptysis, dysphasia, odynophagia, melena, hematochezia, dysuria, hematuria, rash, seizure activity, orthopnea, PND, pedal edema, claudication. Remaining systems are negative. ? ?Physical Exam:  ? ?Blood pressure 122/81, pulse 92, height 5' 8.5" (1.74 m),  weight 160 lb (72.6 kg), last menstrual period 04/14/2017. ? ?General:  Well developed/well nourished in NAD ?Skin warm/dry ?Patient not depressed ?No peripheral clubbing ?Back-normal ?HEENT-normal/normal eyelids ?Neck supple/normal carotid upstroke bilaterally; no bruits; no JVD; no thyromegaly ?chest - CTA/ normal expansion ?CV - RRR/normal S1 and S2; no murmurs, rubs or gallops;  PMI nondisplaced ?Abdomen -NT/ND, no HSM, no mass, + bowel sounds, no bruit ?2+ femoral pulses, no bruits ?Ext-no edema, chords, 2+ DP ?Neuro-grossly nonfocal ? ?ECG -January 01, 2022-normal sinus rhythm, first-degree AV block, poor R wave progression.  Personally reviewed ? ?A/P ? ?1  palpitations-recent monitor showed PACs and PVCs.  Would not add beta-blockade given note of also Mobitz 1 second-degree AV block. ? ?2 Mobitz 1 second-degree AV block-2 episodes as outlined in HPI.  Both likely occurred while she was asleep.  She does snore and sleep apnea could be contributing.  She did not have a prolonged pause and has not had syncope.  We will follow for now. ? ?3 dizziness-patient notes some dizziness with changing positions such as bending over and standing back up and standing and positions for long periods of time.  She has not had syncope.  Symptoms sound potentially to be orthostatic mediated.  I have asked her to increase fluid and sodium intake and we will follow. ? ?4 hyperlipidemia-continue statin.  Also with family history of coronary disease.  We will arrange a calcium score for risk stratification. ? ?5 tobacco abuse-patient counseled on discontinuing. ? ?Olga Millers, MD ? ?

## 2022-01-27 ENCOUNTER — Encounter: Payer: Self-pay | Admitting: Medical-Surgical

## 2022-01-27 ENCOUNTER — Ambulatory Visit (INDEPENDENT_AMBULATORY_CARE_PROVIDER_SITE_OTHER): Payer: BC Managed Care – PPO | Admitting: Cardiology

## 2022-01-27 ENCOUNTER — Encounter: Payer: Self-pay | Admitting: Cardiology

## 2022-01-27 VITALS — BP 122/81 | HR 92 | Ht 68.5 in | Wt 160.0 lb

## 2022-01-27 DIAGNOSIS — Z136 Encounter for screening for cardiovascular disorders: Secondary | ICD-10-CM | POA: Diagnosis not present

## 2022-01-27 DIAGNOSIS — R55 Syncope and collapse: Secondary | ICD-10-CM

## 2022-01-27 DIAGNOSIS — Z720 Tobacco use: Secondary | ICD-10-CM | POA: Diagnosis not present

## 2022-01-27 DIAGNOSIS — I441 Atrioventricular block, second degree: Secondary | ICD-10-CM

## 2022-01-27 DIAGNOSIS — E782 Mixed hyperlipidemia: Secondary | ICD-10-CM | POA: Diagnosis not present

## 2022-01-27 NOTE — Patient Instructions (Signed)
?  Testing/Procedures: ? ?Your physician has requested that you have an echocardiogram. Echocardiography is a painless test that uses sound waves to create images of your heart. It provides your doctor with information about the size and shape of your heart and how well your heart?s chambers and valves are working. This procedure takes approximately one hour. There are no restrictions for this procedure. [facility]-1ST FLOOR IMAGING DEPARTMENT ? ?CORONARY CALCIUM SCORING CT SCAN IN THE [place] IMAGING DEPARTMENT ? ? ?Follow-Up: ?At [facility], you and your health needs are our priority.  As part of our continuing mission to provide you with exceptional heart care, we have created designated Provider Care Teams.  These Care Teams include your primary Cardiologist (physician) and Advanced Practice Providers (APPs -  Physician Assistants and Nurse Practitioners) who all work together to provide you with the care you need, when you need it. ? ?We recommend signing up for the patient portal called "MyChart".  Sign up information is provided on this After Visit Summary.  MyChart is used to connect with patients for Virtual Visits (Telemedicine).  Patients are able to view lab/test results, encounter notes, upcoming appointments, etc.  Non-urgent messages can be sent to your provider as well.   ?To learn more about what you can do with MyChart, go to [email or web address].   ? ?Your next appointment:   ?4 month(s) ? ?The format for your next appointment:   ?In Person ? ?Provider:   ?Olga Millers, MD  ? ? ?

## 2022-01-27 NOTE — Addendum Note (Signed)
Addended bySamuel Bouche on: 01/27/2022 08:29 AM ? ? Modules accepted: Orders ? ?

## 2022-02-02 ENCOUNTER — Ambulatory Visit (INDEPENDENT_AMBULATORY_CARE_PROVIDER_SITE_OTHER): Payer: Self-pay

## 2022-02-02 DIAGNOSIS — Z136 Encounter for screening for cardiovascular disorders: Secondary | ICD-10-CM

## 2022-02-03 ENCOUNTER — Telehealth: Payer: Self-pay | Admitting: *Deleted

## 2022-02-03 DIAGNOSIS — E782 Mixed hyperlipidemia: Secondary | ICD-10-CM

## 2022-02-03 MED ORDER — ATORVASTATIN CALCIUM 40 MG PO TABS: 40.0000 mg | ORAL_TABLET | Freq: Every day | ORAL | 3 refills | Status: DC

## 2022-02-03 NOTE — Telephone Encounter (Signed)
-----   Message from Lewayne Bunting, MD sent at 02/03/2022  1:05 AM EDT ----- ?Minimally elevated ca score, increase lipitor to 40 mg daily, lipids and liver 8 weeks ?Olga Millers ? ?

## 2022-02-04 ENCOUNTER — Ambulatory Visit (HOSPITAL_BASED_OUTPATIENT_CLINIC_OR_DEPARTMENT_OTHER)
Admission: RE | Admit: 2022-02-04 | Discharge: 2022-02-04 | Disposition: A | Discharge status: Home / Self Care | Payer: BC Managed Care – PPO | Source: Ambulatory Visit | Attending: Cardiology | Admitting: Cardiology

## 2022-02-04 DIAGNOSIS — R55 Syncope and collapse: Secondary | ICD-10-CM | POA: Insufficient documentation

## 2022-02-04 LAB — ECHOCARDIOGRAM COMPLETE
AR max vel: 1.62 cm2
AV Area VTI: 1.71 cm2
AV Area mean vel: 1.78 cm2
AV Mean grad: 5 mmHg
AV Peak grad: 9.7 mmHg
Ao pk vel: 1.56 m/s
Area-P 1/2: 4.08 cm2
S' Lateral: 2.4 cm

## 2022-02-04 NOTE — Progress Notes (Signed)
?  Echocardiogram ?2D Echocardiogram has been performed. ? ?Judith [name] ?02/04/2022, 5:04 PM ?

## 2022-03-30 DIAGNOSIS — E782 Mixed hyperlipidemia: Secondary | ICD-10-CM | POA: Diagnosis not present

## 2022-03-31 LAB — HEPATIC FUNCTION PANEL
ALT: 35 IU/L — ABNORMAL HIGH (ref 0–32)
AST: 22 IU/L (ref 0–40)
Albumin: 4.6 g/dL (ref 3.8–4.9)
Alkaline Phosphatase: 175 IU/L — ABNORMAL HIGH (ref 44–121)
Bilirubin Total: 0.3 mg/dL (ref 0.0–1.2)
Bilirubin, Direct: <0.1 mg/dL (ref 0.00–0.40)
Total Protein: 7.1 g/dL (ref 6.0–8.5)

## 2022-03-31 LAB — LIPID PANEL
Chol/HDL Ratio: 3.9 ratio (ref 0.0–4.4)
Cholesterol, Total: 151 mg/dL (ref 100–199)
HDL: 39 mg/dL — ABNORMAL LOW (ref >39)
LDL Chol Calc (NIH): 86 mg/dL (ref 0–99)
Triglycerides: 147 mg/dL (ref 0–149)
VLDL Cholesterol Cal: 26 mg/dL (ref 5–40)

## 2022-04-01 ENCOUNTER — Telehealth: Payer: Self-pay | Admitting: *Deleted

## 2022-04-01 DIAGNOSIS — E782 Mixed hyperlipidemia: Secondary | ICD-10-CM

## 2022-04-01 MED ORDER — EZETIMIBE 10 MG PO TABS: 10.0000 mg | ORAL_TABLET | Freq: Every day | ORAL | 3 refills | Status: DC

## 2022-04-01 NOTE — Telephone Encounter (Signed)
-----   Message from Lewayne Bunting, MD sent at 03/31/2022  7:41 AM EDT ----- LDL not at goal; add zetia 10 mg daily; lipids, liver, GGT and 5' nucleotidase 8 weeks Olga Millers

## 2022-04-05 ENCOUNTER — Encounter: Payer: Self-pay | Admitting: Cardiology

## 2022-04-05 ENCOUNTER — Encounter: Payer: Self-pay | Admitting: Medical-Surgical

## 2022-04-05 NOTE — Telephone Encounter (Signed)
Patient also called wanting to know if you can go over the lab results with her from Dr. Jens Som.

## 2022-05-12 ENCOUNTER — Ambulatory Visit (INDEPENDENT_AMBULATORY_CARE_PROVIDER_SITE_OTHER): Payer: BC Managed Care – PPO | Admitting: Sports Medicine

## 2022-05-12 ENCOUNTER — Ambulatory Visit (INDEPENDENT_AMBULATORY_CARE_PROVIDER_SITE_OTHER): Payer: BC Managed Care – PPO

## 2022-05-12 DIAGNOSIS — M79672 Pain in left foot: Secondary | ICD-10-CM | POA: Diagnosis not present

## 2022-05-12 DIAGNOSIS — R600 Localized edema: Secondary | ICD-10-CM | POA: Diagnosis not present

## 2022-05-12 DIAGNOSIS — M7989 Other specified soft tissue disorders: Secondary | ICD-10-CM

## 2022-05-12 DIAGNOSIS — M25475 Effusion, left foot: Secondary | ICD-10-CM

## 2022-05-12 DIAGNOSIS — M79605 Pain in left leg: Secondary | ICD-10-CM

## 2022-05-12 DIAGNOSIS — M25572 Pain in left ankle and joints of left foot: Secondary | ICD-10-CM

## 2022-05-12 DIAGNOSIS — M25472 Effusion, left ankle: Secondary | ICD-10-CM | POA: Diagnosis not present

## 2022-05-12 DIAGNOSIS — M795 Residual foreign body in soft tissue: Secondary | ICD-10-CM | POA: Diagnosis not present

## 2022-05-12 MED ORDER — PREDNISONE 50 MG PO TABS: ORAL_TABLET | ORAL | 0 refills | Status: DC

## 2022-05-12 NOTE — Assessment & Plan Note (Signed)
Judith [name] is a very pleasant 56 year old female, she comes in with a several day complaint of increasing swelling left lower extremity. This started before her trip to [place], while in the car continued to swell. Slightly erythematous. No trauma. She arrived in [place] and was having severe pain and swelling from mid lower leg down to the toes. She was seen in an emergency department, cellulitis was suspected and she was treated with antibiotics x2, no DVT ultrasound was done. Unfortunately antibiotics have not been beneficial at all. On exam she has fusiform swelling left lower extremity from the mid lower leg down, she has a positive Homans' sign in the lower half of the left lower leg. Otherwise neurovascularly intact. There is tenderness along the tibialis posterior, tenderness at the distal metatarsals. Differential is broad including DVT, venous stasis, tibialis posterior tendinitis. We will get a stat DVT ultrasound, add prednisone considering failure of antibiotics x2, I would like x-rays of her foot and ankle. She will compress with compression hose and elevate when at home. Return to see me in 1.5 to 2 weeks for reevaluation. We may need an MRI.

## 2022-05-12 NOTE — Progress Notes (Signed)
    Procedures performed today:    None.  Independent interpretation of notes and tests performed by another provider:   None.  Brief History, Exam, Impression, and Recommendations:    Left leg swelling Judith [name] is a very pleasant 56 year old female, she comes in with a several day complaint of increasing swelling left lower extremity. This started before her trip to [place], while in the car continued to swell. Slightly erythematous. No trauma. She arrived in [place] and was having severe pain and swelling from mid lower leg down to the toes. She was seen in an emergency department, cellulitis was suspected and she was treated with antibiotics x2, no DVT ultrasound was done. Unfortunately antibiotics have not been beneficial at all. On exam she has fusiform swelling left lower extremity from the mid lower leg down, she has a positive Homans' sign in the lower half of the left lower leg. Otherwise neurovascularly intact. There is tenderness along the tibialis posterior, tenderness at the distal metatarsals. Differential is broad including DVT, venous stasis, tibialis posterior tendinitis. We will get a stat DVT ultrasound, add prednisone considering failure of antibiotics x2, I would like x-rays of her foot and ankle. She will compress with compression hose and elevate when at home. Return to see me in 1.5 to 2 weeks for reevaluation. We may need an MRI.    ____________________________________________ [name]. [name], M.D., ABFM., CAQSM., AME. Primary Care and Sports Medicine [facility]  Adjunct Professor of Family Medicine  [place] [employer]  Restaurant manager, fast food

## 2022-05-13 ENCOUNTER — Ambulatory Visit (INDEPENDENT_AMBULATORY_CARE_PROVIDER_SITE_OTHER): Payer: BC Managed Care – PPO

## 2022-05-13 DIAGNOSIS — M7989 Other specified soft tissue disorders: Secondary | ICD-10-CM | POA: Diagnosis not present

## 2022-05-14 ENCOUNTER — Encounter: Payer: Self-pay | Admitting: Medical-Surgical

## 2022-05-14 ENCOUNTER — Encounter: Payer: Self-pay | Admitting: Sports Medicine

## 2022-05-17 ENCOUNTER — Ambulatory Visit (INDEPENDENT_AMBULATORY_CARE_PROVIDER_SITE_OTHER): Payer: BC Managed Care – PPO | Admitting: Medical-Surgical

## 2022-05-17 VITALS — BP 127/85 | HR 82 | Ht 68.5 in | Wt 165.0 lb

## 2022-05-17 DIAGNOSIS — R599 Enlarged lymph nodes, unspecified: Secondary | ICD-10-CM

## 2022-05-17 NOTE — Progress Notes (Signed)
Established Patient Office Visit  Subjective   Patient ID: Judith [name], female   DOB: 05-18-1966 Age: 56 y.o. MRN: [id number]   Chief Complaint  Patient presents with   Edema    Left lower leg, started with insect bite, getting better, wearing compression stocking    HPI Pleasant 57 year old female presenting today for follow-up on left lower extremity edema.  She was evaluated by Dr. [name] on 05/12/2022 after being treated for cellulitis with 2 rounds of antibiotics and no improvement in her edema.  Notes that her issues started with an insect bite that felt like a sting or a spider bite.  She then went on a trip to [place] and while she was in the car, the left lower edema started to get much worse.  She was evaluated while in [place] and provided with Keflex as well as Bactrim.  She did complete this but on return, her edema was not better.  She was again evaluated and told she just did not have enough antibiotics.  She was placed back on antibiotics at that time.  Once that she was able to get in with Dr. [name], the antibiotics were stopped and she started a 5-day burst of prednisone to see if this would be beneficial.  She had a ultrasound of the left lower extremity that was negative for DVT but did find an incidental swollen lymph node greater than 2 cm in size.  Of interest, she also had x-rays of the left ankle that showed a small foreign object for which there is no explanation.  Today she presents to discuss further testing and evaluation of the swollen lymph node.  Has not had any further issues with swelling since completing the prednisone.  She is wearing a compression sock during the day which she feels has helped greatly in reducing the edema.  She does not wear it at night and does not experience any significant swelling with it off.  No fevers, chills, or myalgias to indicate a systemic infection at this point.  The bug bite on her left medial calf has scabbed over and is  healing well without redness or drainage   Objective:    Vitals:   05/17/22 1559  BP: 127/85  Pulse: 82  Height: 5' 8.5" (1.74 m)  Weight: 165 lb (74.8 kg)  SpO2: 97%  BMI (Calculated): 24.72     Physical Exam Vitals and nursing note reviewed.  Constitutional:      General: She is not in acute distress.    Appearance: Normal appearance. She is not ill-appearing.  HENT:     Head: Normocephalic and atraumatic.  Cardiovascular:     Rate and Rhythm: Normal rate and regular rhythm.     Pulses: Normal pulses.     Heart sounds: Normal heart sounds.  Pulmonary:     Effort: Pulmonary effort is normal. No respiratory distress.     Breath sounds: Normal breath sounds. No wheezing, rhonchi or rales.  Musculoskeletal:     Right lower leg: No edema.     Left lower leg: No edema.  Skin:    General: Skin is warm and dry.     Comments: <0.5 cm round grayish-brown scab to the left medial calf, no surrounding erythema, fluctuance, or edema.  Neurological:     Mental Status: She is alert and oriented to person, place, and time.  Psychiatric:        Mood and Affect: Mood normal.  Behavior: Behavior normal.        Thought Content: Thought content normal.        Judgment: Judgment normal.   No results found for this or any previous visit (from the past 24 hour(s)).     The 10-year ASCVD risk score (Arnett DK, et al., 2019) is: 5.6%   Values used to calculate the score:     Age: 46 years     Sex: Female     Is Non-Hispanic African American: No     Diabetic: No     Tobacco smoker: Yes     Systolic Blood Pressure: 127 mmHg     Is BP treated: No     HDL Cholesterol: 39 mg/dL     Total Cholesterol: 151 mg/dL   Assessment & Plan:   1. Enlarged lymph node Left lower extremity edema has fully resolved with the use of prednisone as well as a compression sock.  We did review the results of her DVT ultrasound as well as the images for her left ankle foreign object.  At this point,  feel that she does need further evaluation of the swollen lymph node near the saphenofemoral junction.  Ordering a dedicated soft tissue ultrasound for further evaluation. - [place] LT LOWER EXTREM LTD SOFT TISSUE NON VASCULAR; Future  Return if symptoms worsen or fail to improve.  Further follow-up pending ultrasound results ___________________________________________ Thayer Ohm, DNP, APRN, FNP-BC Primary Care and Sports Medicine [facility]

## 2022-05-27 ENCOUNTER — Ambulatory Visit (INDEPENDENT_AMBULATORY_CARE_PROVIDER_SITE_OTHER): Payer: BC Managed Care – PPO

## 2022-05-27 DIAGNOSIS — R590 Localized enlarged lymph nodes: Secondary | ICD-10-CM

## 2022-05-27 DIAGNOSIS — R599 Enlarged lymph nodes, unspecified: Secondary | ICD-10-CM

## 2022-05-28 NOTE — Progress Notes (Signed)
HPI: FU palpitations.  Laboratories February 2023 showed hemoglobin 15.6, potassium 4.4, creatinine 0.72, TSH 2.29. Monitor March 2023 showed sinus bradycardia, normal sinus rhythm, sinus tachycardia, occasional PAC and PVC and Mobitz 1 second-degree AV block for 1 beat at 2:07 PM and 3:55 AM.  She feels as though she was likely taking a nap at 207 and was asleep at 3:55 AM.  She does snore.  Calcium score April 2023 3 which was 75th percentile.  Echocardiogram April 2023 showed normal LV function, grade 1 diastolic dysfunction, trace aortic insufficiency.  Lower extremity venous Dopplers July 2023 showed no DVT on the left. Since last seen, she has dyspnea with more vigorous activities but not routine activities.  No orthopnea, PND, pedal edema, chest pain or syncope.  Palpitations have improved.  Current Outpatient Medications  Medication Sig Dispense Refill   atorvastatin (LIPITOR) 40 MG tablet Take 1 tablet (40 mg total) by mouth daily. 90 tablet 3   ezetimibe (ZETIA) 10 MG tablet Take 1 tablet (10 mg total) by mouth daily. 90 tablet 3   meloxicam (MOBIC) 15 MG tablet TAKE 1 TABLET (15 MG TOTAL) BY MOUTH DAILY. 90 tablet 0   pregabalin (LYRICA) 50 MG capsule Take 50 mg by mouth daily.     No current facility-administered medications for this visit.     Past Medical History:  Diagnosis Date   Anxiety    Colon polyps    Hypercholesteremia     Past Surgical History:  Procedure Laterality Date   LAPAROSCOPY      Social History   Socioeconomic History   Marital status: Married    Spouse name: Tinnie Gens Sanda   Number of children: 3   Years of education: Not on file   Highest education level: Not on file  Occupational History    Employer: [employer]   Tobacco Use   Smoking status: Every Day    Packs/day: 0.50    Types: Cigarettes   Smokeless tobacco: Never   Tobacco comments:    also uses nicoteine patch  Vaping Use   Vaping Use: Never used  Substance and Sexual  Activity   Alcohol use: Yes    Comment: rarely   Drug use: Not Currently    Types: Marijuana   Sexual activity: Yes    Partners: Male    Birth control/protection: Post-menopausal  Other Topics Concern   Not on file  Social History Narrative   Not on file   Social Determinants of Health   Financial Resource Strain: Not on file  Food Insecurity: Not on file  Transportation Needs: Not on file  Physical Activity: Not on file  Stress: Not on file  Social Connections: Not on file  Intimate Partner Violence: Not on file    Family History  Problem Relation Age of Onset   Heart attack Mother    Alcoholism Mother    Heart attack Father    Alcoholism Father    Hyperlipidemia Brother    Stroke Brother    Osteopenia Sister     ROS: no fevers or chills, productive cough, hemoptysis, dysphasia, odynophagia, melena, hematochezia, dysuria, hematuria, rash, seizure activity, orthopnea, PND, pedal edema, claudication. Remaining systems are negative.  Physical Exam: Well-developed well-nourished in no acute distress.  Skin is warm and dry.  HEENT is normal.  Neck is supple.  Chest is clear to auscultation with normal expansion.  Cardiovascular exam is regular rate and rhythm.  Abdominal exam nontender or distended. No masses palpated.  Extremities show no edema. neuro grossly intact   A/P  1 palpitations-previous monitor showed PACs and PVCs.  We have not added beta-blockade given history of Mobitz 1 second-degree AV block.  Symptoms have improved.  2 Mobitz 1 second-degree AV block-these previously occurred while she was asleep and it is felt sleep apnea may have contributed.  No history of syncope.  3 history of dizziness-felt orthostatic mediated.  These have improved compared to last office visit.  Continue increased fluid intake.  4 hyperlipidemia-continue statin.  Previous alkaline phosphatase mildly elevated.  Plan will be to repeat liver functions with GGT and 5'  nucleotidase.  5 minimally elevated calcium score-continue statin.  6 tobacco abuse-patient counseled on discontinuing.  7 snoring-schedule sleep study to rule out sleep apnea.  Olga Millers, MD

## 2022-05-31 ENCOUNTER — Encounter: Payer: Self-pay | Admitting: Medical-Surgical

## 2022-05-31 ENCOUNTER — Other Ambulatory Visit: Payer: Self-pay | Admitting: Medical-Surgical

## 2022-06-09 ENCOUNTER — Ambulatory Visit (INDEPENDENT_AMBULATORY_CARE_PROVIDER_SITE_OTHER): Payer: BC Managed Care – PPO | Admitting: Cardiology

## 2022-06-09 ENCOUNTER — Telehealth: Payer: Self-pay | Admitting: *Deleted

## 2022-06-09 ENCOUNTER — Encounter: Payer: Self-pay | Admitting: Cardiology

## 2022-06-09 ENCOUNTER — Other Ambulatory Visit: Payer: Self-pay | Admitting: *Deleted

## 2022-06-09 VITALS — BP 130/76 | HR 100 | Ht 68.5 in | Wt 164.1 lb

## 2022-06-09 DIAGNOSIS — R002 Palpitations: Secondary | ICD-10-CM | POA: Diagnosis not present

## 2022-06-09 DIAGNOSIS — I441 Atrioventricular block, second degree: Secondary | ICD-10-CM | POA: Diagnosis not present

## 2022-06-09 DIAGNOSIS — R0683 Snoring: Secondary | ICD-10-CM | POA: Diagnosis not present

## 2022-06-09 DIAGNOSIS — E782 Mixed hyperlipidemia: Secondary | ICD-10-CM

## 2022-06-09 NOTE — Telephone Encounter (Signed)
Pt will come in at the 2:40 slot to to see Dr.Crenshaw.

## 2022-06-09 NOTE — Patient Instructions (Signed)
Medication Instructions:   Your physician recommends that you continue on your current medications as directed. Please refer to the Current Medication list given to you today.   *If you need a refill on your cardiac medications before your next appointment, please call your pharmacy*   Lab Work:  TODAY!!!!  LFT/GGT/NUCLEOTIDASE  If you have labs (blood work) drawn today and your tests are completely normal, you will receive your results only by: MyChart Message (if you have MyChart) OR A paper copy in the mail If you have any lab test that is abnormal or we need to change your treatment, we will call you to review the results.   Testing/Procedures:  Your physician has recommended that you have a sleep study. This test records several body functions during sleep, including: brain activity, eye movement, oxygen and carbon dioxide blood levels, heart rate and rhythm, breathing rate and rhythm, the flow of air through your mouth and nose, snoring, body muscle movements, and chest and belly movement.    Follow-Up: At [facility], you and your health needs are our priority.  As part of our continuing mission to provide you with exceptional heart care, we have created designated Provider Care Teams.  These Care Teams include your primary Cardiologist (physician) and Advanced Practice Providers (APPs -  Physician Assistants and Nurse Practitioners) who all work together to provide you with the care you need, when you need it.  We recommend signing up for the patient portal called "MyChart".  Sign up information is provided on this After Visit Summary.  MyChart is used to connect with patients for Virtual Visits (Telemedicine).  Patients are able to view lab/test results, encounter notes, upcoming appointments, etc.  Non-urgent messages can be sent to your provider as well.   To learn more about what you can do with MyChart, go to [email or web address].    Your next appointment:   1  year(s)  The format for your next appointment:   In Person  Provider:   Olga Millers, MD    Other Instructions  Your physician wants you to follow-up in: 1 year with Dr.Crenshaw.  You will receive a reminder letter in the mail two months in advance. If you don't receive a letter, please call our office to schedule the follow-up appointment.   Important Information About Sugar

## 2022-06-09 NOTE — Telephone Encounter (Signed)
S/w pt to move appt up today to 2:40.  Will call pt back in 30 min sent message to work.

## 2022-06-10 ENCOUNTER — Other Ambulatory Visit: Payer: Self-pay | Admitting: Medical-Surgical

## 2022-06-10 ENCOUNTER — Other Ambulatory Visit: Payer: Self-pay | Admitting: *Deleted

## 2022-06-10 DIAGNOSIS — I441 Atrioventricular block, second degree: Secondary | ICD-10-CM

## 2022-06-10 DIAGNOSIS — E782 Mixed hyperlipidemia: Secondary | ICD-10-CM

## 2022-06-10 DIAGNOSIS — R0683 Snoring: Secondary | ICD-10-CM

## 2022-06-10 DIAGNOSIS — R002 Palpitations: Secondary | ICD-10-CM

## 2022-06-10 DIAGNOSIS — R599 Enlarged lymph nodes, unspecified: Secondary | ICD-10-CM

## 2022-06-11 ENCOUNTER — Ambulatory Visit: Payer: BC Managed Care – PPO | Admitting: Medical-Surgical

## 2022-06-17 ENCOUNTER — Telehealth: Payer: Self-pay | Admitting: *Deleted

## 2022-06-17 ENCOUNTER — Other Ambulatory Visit: Payer: Self-pay | Admitting: Cardiology

## 2022-06-17 DIAGNOSIS — R0683 Snoring: Secondary | ICD-10-CM

## 2022-06-17 NOTE — Telephone Encounter (Signed)
[employer] denied in lab sleep study. Approved HST type III. Auth # [id number]. Valid dates 06/17/22 to 08/15/22.

## 2022-06-18 ENCOUNTER — Ambulatory Visit (INDEPENDENT_AMBULATORY_CARE_PROVIDER_SITE_OTHER): Payer: BC Managed Care – PPO

## 2022-06-18 DIAGNOSIS — E782 Mixed hyperlipidemia: Secondary | ICD-10-CM | POA: Diagnosis not present

## 2022-06-18 DIAGNOSIS — R599 Enlarged lymph nodes, unspecified: Secondary | ICD-10-CM

## 2022-06-18 DIAGNOSIS — I441 Atrioventricular block, second degree: Secondary | ICD-10-CM | POA: Diagnosis not present

## 2022-06-18 DIAGNOSIS — R590 Localized enlarged lymph nodes: Secondary | ICD-10-CM | POA: Diagnosis not present

## 2022-06-18 DIAGNOSIS — R002 Palpitations: Secondary | ICD-10-CM | POA: Diagnosis not present

## 2022-06-18 DIAGNOSIS — R0683 Snoring: Secondary | ICD-10-CM | POA: Diagnosis not present

## 2022-06-21 ENCOUNTER — Other Ambulatory Visit: Payer: Self-pay | Admitting: Medical-Surgical

## 2022-06-21 ENCOUNTER — Encounter: Payer: Self-pay | Admitting: Medical-Surgical

## 2022-06-21 ENCOUNTER — Encounter: Payer: Self-pay | Admitting: Cardiology

## 2022-06-21 DIAGNOSIS — R599 Enlarged lymph nodes, unspecified: Secondary | ICD-10-CM

## 2022-06-21 LAB — HEPATIC FUNCTION PANEL
AG Ratio: 1.7 (calc) (ref 1.0–2.5)
ALT: 21 U/L (ref 6–29)
AST: 18 U/L (ref 10–35)
Albumin: 4.5 g/dL (ref 3.6–5.1)
Alkaline phosphatase (APISO): 159 U/L — ABNORMAL HIGH (ref 37–153)
Bilirubin, Direct: 0.1 mg/dL (ref 0.0–0.2)
Globulin: 2.7 g/dL (calc) (ref 1.9–3.7)
Indirect Bilirubin: 0.2 mg/dL (calc) (ref 0.2–1.2)
Total Bilirubin: 0.3 mg/dL (ref 0.2–1.2)
Total Protein: 7.2 g/dL (ref 6.1–8.1)

## 2022-06-21 LAB — GAMMA GT: GGT: 13 U/L (ref 3–70)

## 2022-06-21 LAB — NUCLEOTIDASE, 5', BLOOD: 5-Nucleotidase: 3 U/L (ref 0–10)

## 2022-06-22 ENCOUNTER — Ambulatory Visit (HOSPITAL_BASED_OUTPATIENT_CLINIC_OR_DEPARTMENT_OTHER): Payer: BC Managed Care – PPO | Admitting: Cardiovascular Disease

## 2022-06-22 DIAGNOSIS — R0683 Snoring: Secondary | ICD-10-CM

## 2022-06-29 ENCOUNTER — Ambulatory Visit (HOSPITAL_BASED_OUTPATIENT_CLINIC_OR_DEPARTMENT_OTHER): Payer: BC Managed Care – PPO | Attending: Cardiology | Admitting: Cardiology

## 2022-06-29 DIAGNOSIS — R0603 Acute respiratory distress: Secondary | ICD-10-CM | POA: Insufficient documentation

## 2022-06-29 DIAGNOSIS — R0683 Snoring: Secondary | ICD-10-CM | POA: Diagnosis not present

## 2022-06-29 DIAGNOSIS — G4733 Obstructive sleep apnea (adult) (pediatric): Secondary | ICD-10-CM | POA: Insufficient documentation

## 2022-06-30 ENCOUNTER — Encounter: Payer: Self-pay | Admitting: Medical-Surgical

## 2022-06-30 ENCOUNTER — Ambulatory Visit (INDEPENDENT_AMBULATORY_CARE_PROVIDER_SITE_OTHER): Payer: BC Managed Care – PPO | Admitting: Medical-Surgical

## 2022-06-30 VITALS — BP 114/79 | HR 89 | Resp 20 | Ht 68.0 in | Wt 164.6 lb

## 2022-06-30 DIAGNOSIS — R599 Enlarged lymph nodes, unspecified: Secondary | ICD-10-CM | POA: Diagnosis not present

## 2022-06-30 DIAGNOSIS — R748 Abnormal levels of other serum enzymes: Secondary | ICD-10-CM

## 2022-06-30 MED ORDER — FLUOCINONIDE EMULSIFIED BASE 0.05 % EX CREA: 1.0000 | TOPICAL_CREAM | Freq: Two times a day (BID) | CUTANEOUS | 1 refills | Status: DC

## 2022-06-30 NOTE — Progress Notes (Signed)
Established Patient Office Visit  Subjective   Patient ID: Judith [name], female   DOB: [birth year] Age: 56 y.o. MRN: [id number]   Chief Complaint  Patient presents with   Results   Follow-up   HPI Very pleasant 56 year old female presenting today for the following:  Swollen lymph node: She is worried about what the imaging findings regarding her enlarged lymph node in the left inguinal area.  She did have the original ultrasound for further investigation which showed room system a possible cluster of lymph nodes versus an enlarged lymph node.  After a few weeks, she did have a repeat ultrasound which again showed lymph nodes that could possibly be a cluster or a single enlarged node.  There was no significant increase in size at that time.  Per the report recommendations, she should plan to have a repeat ultrasound in 3 months.  She does not have any other concerning symptoms such as unexplained weight loss, unusual night sweats, fever, chills, or fatigue.  No recent viral illnesses and denies risk for STIs.  Of note, this lymph node was enlarged at the time when she had some lower extremity edema that was unexplained.  This may be reactive in nature but repeat imaging will provide more information in a few months.  Recently noticed that her alkaline phosphatase is elevated.  She knows that this is something that is monitored when there is a bone cancer such as in her husband.  She is worried that there may be something going on causing her alkaline phosphatase to be high that we have not caught onto yet.   Objective:    Vitals:   06/30/22 0912  BP: 114/79  Pulse: 89  Resp: 20  Height: 5\' 8"  (1.727 m)  Weight: 164 lb 9.6 oz (74.7 kg)  SpO2: 99%  BMI (Calculated): 25.03   Physical Exam Vitals reviewed.  Constitutional:      General: She is not in acute distress.    Appearance: Normal appearance. She is not ill-appearing.  HENT:     Head: Normocephalic and atraumatic.  Cardiovascular:      Rate and Rhythm: Normal rate and regular rhythm.     Pulses: Normal pulses.     Heart sounds: Normal heart sounds.  Pulmonary:     Effort: Pulmonary effort is normal. No respiratory distress.     Breath sounds: Normal breath sounds. No wheezing, rhonchi or rales.  Skin:    General: Skin is warm and dry.  Neurological:     Mental Status: She is alert and oriented to person, place, and time.  Psychiatric:        Mood and Affect: Mood normal.        Behavior: Behavior normal.        Thought Content: Thought content normal.        Judgment: Judgment normal.   No results found for this or any previous visit (from the past 24 hour(s)).     The 10-year ASCVD risk score (Arnett DK, et al., 2019) is: 4.6%   Values used to calculate the score:     Age: 34 years     Sex: Female     Is Non-Hispanic African American: No     Diabetic: No     Tobacco smoker: Yes     Systolic Blood Pressure: 114 mmHg     Is BP treated: No     HDL Cholesterol: 39 mg/dL     Total Cholesterol: 151 mg/dL  Assessment & Plan:   1. Enlarged lymph node Discussed her imaging findings as well as the relative sizes of the ultrasound findings on 2 separate dates.  This does likely present as a cluster however visualization is subpar to be able to determine the accuracy of this.  Plan to repeat her ultrasound in 3 months as recommended to evaluate for enlargement or reduction in size.  2. Elevated alkaline phosphatase level Reviewed her most recent alkaline phosphatase levels.  Her most recent was only 6 points above the upper limit of normal which is not clinically significant in the setting of no associated symptoms.  Discussed multiple causes of elevations in alkaline phosphatase.  No identifiable contributor at this point however this is something that we will trend in the near future.  Plan to recheck labs at the time of her ultrasound to make sure this is returning to normal.  Overall I feel that she is worried  well and just wants to make sure that there is a plan for follow-up of these abnormalities.  We will plan for an in person follow-up to discuss her results shortly after the completion of her repeat labs and imaging.  Patient verbalized understanding and is agreeable to the plan.  Return in about 3 months (around 09/29/2022) for Lymph node/alkaline phosphatase follow-up. ___________________________________________ Thayer Ohm, DNP, APRN, FNP-BC Primary Care and Sports Medicine [facility] [place]

## 2022-07-05 ENCOUNTER — Encounter: Payer: Self-pay | Admitting: Medical-Surgical

## 2022-07-05 DIAGNOSIS — G4733 Obstructive sleep apnea (adult) (pediatric): Secondary | ICD-10-CM | POA: Insufficient documentation

## 2022-07-05 NOTE — Procedures (Signed)
   Patient Name: Judith, [name] Date: 06/29/2022 Gender: Female D.O.B: [birth year] Age (years): 56 Referring Provider: Olga Millers Height (inches): 68 Interpreting Physician: Armanda Magic MD, ABSM Weight (lbs): 143 RPSGT: Iuka Sink BMI: 22 MRN: [id number] Neck Size: 14.00  CLINICAL INFORMATION Sleep Study Type: HST  Indication for sleep study: N/A  Epworth Sleepiness Score: N/A  SLEEP STUDY TECHNIQUE A multi-channel overnight portable sleep study was performed. The channels recorded were: nasal airflow, thoracic respiratory movement, and oxygen saturation with a pulse oximetry. Snoring was also monitored.  MEDICATIONS Patient self administered medications include: N/A.  SLEEP ARCHITECTURE Patient was studied for 368.2 minutes. The sleep efficiency was 100.0 % and the patient was supine for 0%. The arousal index was 0.0 per hour.  RESPIRATORY PARAMETERS The overall AHI was 25.6 per hour, with a central apnea index of 0 per hour.  The oxygen nadir was 89% during sleep.  CARDIAC DATA Mean heart rate during sleep was 83.7 bpm.  IMPRESSIONS - Moderate obstructive sleep apnea occurred during this study (AHI = 25.6/h). - Mild oxygen desaturation was noted during this study (Min O2 = 89%). - Patient snored 11.8% during the sleep.  DIAGNOSIS - Obstructive Sleep Apnea (G47.33)  RECOMMENDATIONS - Recommend in lab CPAP titration. - Avoid alcohol, sedatives and other CNS depressants that may worsen sleep apnea and disrupt normal sleep architecture. - Sleep hygiene should be reviewed to assess factors that may improve sleep quality. - Weight management and regular exercise should be initiated or continued.  [Electronically signed] 07/05/2022 10:00 AM  Armanda Magic MD, ABSM Diplomate, American Board of Sleep Medicine

## 2022-07-07 ENCOUNTER — Encounter: Payer: Self-pay | Admitting: Cardiology

## 2022-07-08 NOTE — Addendum Note (Signed)
Addended byChristen Butter on: 07/08/2022 01:58 PM   Modules accepted: Orders

## 2022-07-16 ENCOUNTER — Telehealth: Payer: Self-pay | Admitting: *Deleted

## 2022-07-16 DIAGNOSIS — R0683 Snoring: Secondary | ICD-10-CM

## 2022-07-16 DIAGNOSIS — G4733 Obstructive sleep apnea (adult) (pediatric): Secondary | ICD-10-CM

## 2022-07-16 NOTE — Telephone Encounter (Signed)
-----   Message from Lauralee Evener, [place] sent at 07/12/2022  1:18 PM EDT -----  ----- Message ----- From: Sueanne Margarita, MD Sent: 07/05/2022  10:02 AM EDT To: Cv Div Sleep Studies  Please let patient know that they have sleep apnea.  Recommend therapeutic CPAP titration for treatment of patient's sleep disordered breathing.  If unable to perform an in lab titration then initiate ResMed auto CPAP from 4 to 15cm H2O with heated humidity and mask of choice and overnight pulse ox on CPAP.

## 2022-07-16 NOTE — Addendum Note (Signed)
Addended by: Freada Bergeron on: 07/16/2022 02:20 PM   Modules accepted: Orders

## 2022-07-16 NOTE — Telephone Encounter (Signed)
The patient has been notified of the result and verbalized understanding.  All questions (if any) were answered. Marolyn Hammock, [place] 07/16/2022 6:60 PM    Will precert titration

## 2022-07-27 ENCOUNTER — Other Ambulatory Visit: Payer: Self-pay | Admitting: Sports Medicine

## 2022-07-27 DIAGNOSIS — M5412 Radiculopathy, cervical region: Secondary | ICD-10-CM

## 2022-08-03 ENCOUNTER — Other Ambulatory Visit: Payer: Self-pay | Admitting: Osteopathic Medicine

## 2022-08-03 ENCOUNTER — Other Ambulatory Visit: Payer: Self-pay | Admitting: Psychology

## 2022-08-03 DIAGNOSIS — Z09 Encounter for follow-up examination after completed treatment for conditions other than malignant neoplasm: Secondary | ICD-10-CM

## 2022-08-09 NOTE — Telephone Encounter (Signed)
Prior Authorization for TITRATION sent to [facility] via web portal. Tracking Number.  DENIED [id number]  Upon patient request DME selection is [facility] Patient understands he will be contacted by [facility] to set up his cpap. Patient understands to call if [facility] does not contact him with new setup in a timely manner. Patient understands they will be called once confirmation has been received from [facility] that they have received their new machine to schedule 10 week follow up appointment.   [facility] notified of new cpap order  Please add to airview Patient was grateful for the call and thanked me.

## 2022-08-20 ENCOUNTER — Other Ambulatory Visit: Payer: Self-pay | Admitting: Medical-Surgical

## 2022-08-20 ENCOUNTER — Other Ambulatory Visit: Payer: Self-pay | Admitting: Sports Medicine

## 2022-08-20 NOTE — Telephone Encounter (Signed)
Last office visit 06/30/2022  Last filled 12/11/2021

## 2022-08-24 DIAGNOSIS — G4733 Obstructive sleep apnea (adult) (pediatric): Secondary | ICD-10-CM | POA: Diagnosis not present

## 2022-09-09 ENCOUNTER — Other Ambulatory Visit: Payer: Self-pay | Admitting: Medical-Surgical

## 2022-09-09 DIAGNOSIS — Z09 Encounter for follow-up examination after completed treatment for conditions other than malignant neoplasm: Secondary | ICD-10-CM

## 2022-09-10 ENCOUNTER — Ambulatory Visit
Admission: RE | Admit: 2022-09-10 | Discharge: 2022-09-10 | Disposition: A | Discharge status: Home / Self Care | Payer: BC Managed Care – PPO | Source: Ambulatory Visit | Attending: Osteopathic Medicine | Admitting: Osteopathic Medicine

## 2022-09-10 ENCOUNTER — Other Ambulatory Visit: Payer: Self-pay | Admitting: Medical-Surgical

## 2022-09-10 DIAGNOSIS — Z09 Encounter for follow-up examination after completed treatment for conditions other than malignant neoplasm: Secondary | ICD-10-CM

## 2022-09-10 DIAGNOSIS — R599 Enlarged lymph nodes, unspecified: Secondary | ICD-10-CM

## 2022-09-10 DIAGNOSIS — R92333 Mammographic heterogeneous density, bilateral breasts: Secondary | ICD-10-CM | POA: Diagnosis not present

## 2022-09-23 DIAGNOSIS — G4733 Obstructive sleep apnea (adult) (pediatric): Secondary | ICD-10-CM | POA: Diagnosis not present

## 2022-09-28 ENCOUNTER — Other Ambulatory Visit (INDEPENDENT_AMBULATORY_CARE_PROVIDER_SITE_OTHER): Payer: BC Managed Care – PPO

## 2022-09-28 ENCOUNTER — Other Ambulatory Visit: Payer: Self-pay | Admitting: Medical-Surgical

## 2022-09-28 ENCOUNTER — Ambulatory Visit (INDEPENDENT_AMBULATORY_CARE_PROVIDER_SITE_OTHER): Payer: BC Managed Care – PPO

## 2022-09-28 DIAGNOSIS — R599 Enlarged lymph nodes, unspecified: Secondary | ICD-10-CM | POA: Diagnosis not present

## 2022-09-28 DIAGNOSIS — R590 Localized enlarged lymph nodes: Secondary | ICD-10-CM

## 2022-09-28 DIAGNOSIS — R748 Abnormal levels of other serum enzymes: Secondary | ICD-10-CM | POA: Diagnosis not present

## 2022-09-29 LAB — COMPLETE METABOLIC PANEL WITH GFR
AG Ratio: 1.7 (calc) (ref 1.0–2.5)
ALT: 27 U/L (ref 6–29)
AST: 20 U/L (ref 10–35)
Albumin: 4.8 g/dL (ref 3.6–5.1)
Alkaline phosphatase (APISO): 172 U/L — ABNORMAL HIGH (ref 37–153)
BUN: 21 mg/dL (ref 7–25)
CO2: 28 mmol/L (ref 20–32)
Calcium: 10.8 mg/dL — ABNORMAL HIGH (ref 8.6–10.4)
Chloride: 107 mmol/L (ref 98–110)
Creat: 0.84 mg/dL (ref 0.50–1.03)
Globulin: 2.9 g/dL (calc) (ref 1.9–3.7)
Glucose, Bld: 97 mg/dL (ref 65–99)
Potassium: 5.2 mmol/L (ref 3.5–5.3)
Sodium: 144 mmol/L (ref 135–146)
Total Bilirubin: 0.4 mg/dL (ref 0.2–1.2)
Total Protein: 7.7 g/dL (ref 6.1–8.1)
eGFR: 81 mL/min/{1.73_m2} (ref >=60)

## 2022-09-29 NOTE — Addendum Note (Signed)
Addended byChristen Butter on: 09/29/2022 07:20 PM   Modules accepted: Orders

## 2022-10-05 ENCOUNTER — Ambulatory Visit (INDEPENDENT_AMBULATORY_CARE_PROVIDER_SITE_OTHER): Payer: BC Managed Care – PPO | Admitting: Medical-Surgical

## 2022-10-05 ENCOUNTER — Encounter: Payer: Self-pay | Admitting: Medical-Surgical

## 2022-10-05 VITALS — BP 130/73 | HR 95 | Resp 20 | Ht 68.0 in | Wt 166.8 lb

## 2022-10-05 DIAGNOSIS — R599 Enlarged lymph nodes, unspecified: Secondary | ICD-10-CM

## 2022-10-05 DIAGNOSIS — R748 Abnormal levels of other serum enzymes: Secondary | ICD-10-CM | POA: Diagnosis not present

## 2022-10-05 NOTE — Progress Notes (Signed)
Medical screening examination/treatment was performed by qualified nurse practitioner student and as supervising provider I was immediately available for consultation/collaboration. I have reviewed documentation and agree with assessment and plan. ° °Eleshia Wooley L. Margarit Minshall, DNP, APRN, FNP-BC °[facility] °Primary Care and Sports Medicine ° °

## 2022-10-05 NOTE — Progress Notes (Signed)
   Established Patient Office Visit  Subjective   Patient ID: Judith [name], female    DOB: Jun 17, 1966  Age: 56 y.o. MRN: [id number]  Chief Complaint  Patient presents with   lymph node   Follow-up    HPI    Ayame is a very pleasant 56 year old female here today for follow up regarding the enlarge lymph node on her left inguinal area. Denies fever, no unintentional weight loss, and no changes on day time and night time sweats related to her menopausal status. She voiced significant concerns into finding out what is the enlargement could be. We are also following up elevated alkaline phosphatase levels.   Review of Systems  Constitutional: Negative.   Respiratory: Negative.    Cardiovascular: Negative.   Gastrointestinal: Negative.   Genitourinary: Negative.   Musculoskeletal: Negative.       Objective:     BP 130/73 (BP Location: Left Arm, Cuff Size: Normal)   Pulse 95   Resp 20   Ht 5\' 8"  (1.727 m)   Wt 75.7 kg   LMP 04/14/2017   SpO2 98%   BMI 25.36 kg/m    Physical Exam Constitutional:      General: She is not in acute distress.    Appearance: Normal appearance. She is not ill-appearing, toxic-appearing or diaphoretic.  HENT:     Head: Normocephalic and atraumatic.  Eyes:     General: No scleral icterus. Cardiovascular:     Rate and Rhythm: Normal rate and regular rhythm.     Pulses: Normal pulses.     Heart sounds: Normal heart sounds.  Pulmonary:     Effort: Pulmonary effort is normal.     Breath sounds: Normal breath sounds.  Skin:    General: Skin is warm and dry.  Neurological:     General: No focal deficit present.     Mental Status: She is alert and oriented to person, place, and time. Mental status is at baseline.  Psychiatric:        Mood and Affect: Mood normal.        Behavior: Behavior normal.        Thought Content: Thought content normal.        Judgment: Judgment normal.      No results found for any visits on 10/05/22.    The  10-year ASCVD risk score (Arnett DK, et al., 2019) is: 5.8%    Assessment & Plan:   1. Enlarged lymph node She does not have any new symptoms today, and although there was no changes with the size of this enlarge lymph node according to the serials of ultrasounds that we've done over the course of 5 months, she is very concern and we will go ahead and refer her to General Surgery for further evaluation / testings.   - Ambulatory referral to General Surgery  2. Elevated alkaline phosphatase level Orders in placed for a complete metabolic panel to be drawn in two weeks to further see trends of her alkaline phosphatase level.     Return if symptoms worsen or fail to improve.    2020, RN Student NP

## 2022-10-06 ENCOUNTER — Encounter: Payer: Self-pay | Admitting: Cardiology

## 2022-10-14 ENCOUNTER — Encounter: Payer: Self-pay | Admitting: Medical-Surgical

## 2022-10-14 ENCOUNTER — Ambulatory Visit: Payer: BC Managed Care – PPO | Attending: Cardiology | Admitting: Cardiology

## 2022-10-14 ENCOUNTER — Encounter: Payer: Self-pay | Admitting: Cardiology

## 2022-10-14 VITALS — Ht 68.0 in | Wt 168.0 lb

## 2022-10-14 DIAGNOSIS — G4733 Obstructive sleep apnea (adult) (pediatric): Secondary | ICD-10-CM | POA: Diagnosis not present

## 2022-10-14 NOTE — Patient Instructions (Signed)
Medication Instructions:  Your physician recommends that you continue on your current medications as directed. Please refer to the Current Medication list given to you today.  *If you need a refill on your cardiac medications before your next appointment, please call your pharmacy*   Lab Work: None. If you have labs (blood work) drawn today and your tests are completely normal, you will receive your results only by: MyChart Message (if you have MyChart) OR A paper copy in the mail If you have any lab test that is abnormal or we need to change your treatment, we will call you to review the results.   Testing/Procedures: None.   Follow-Up:  Your next appointment:   1 year(s)  The format for your next appointment:   In Person  Provider:   None     Important Information About Sugar

## 2022-10-14 NOTE — Progress Notes (Signed)
SLEEP MEDICINE VIRTUAL CONSULT NOTE via Video Note   Because of Jamaiyah Buxton's co-morbid illnesses, she is at least at moderate risk for complications without adequate follow up.  This format is felt to be most appropriate for this patient at this time.  All issues noted in this document were discussed and addressed.  A limited physical exam was performed with this format.  Please refer to the patient's chart for her consent to telehealth for [facility].      Date:  10/14/2022   ID:  Inetta Fermo Giller, DOB Jun 29, 1966, MRN [id number] The patient was identified using 2 identifiers.  Patient Location: Home Provider Location: Office/Clinic   PCP:  Christen Butter, NP   [facility] Providers Cardiologist:  Olga Millers, MD  Evaluation Performed:  Consultation - Jameah Coberly was referred by Olga Millers MD for the evaluation of OSA.  Chief Complaint:  OSA  History of Present Illness:    Jenene Heard is a 56 y.o. female who is being seen today for the evaluation of OSA at the request of Olga Millers, MD.  Lavender Plotkin is a 56 y.o. female with a history of hyperlipidemia, Mobitz 1 second-degree AV block during sleep and PVCs who was seen by Dr. Jens Som back in August and was complaining about problems with snoring.  She underwent a home sleep study which demonstrated moderate obstructive sleep apnea with an AHI 25.6/h and O2 saturations as low as 89%.  She was subsequently started on auto CPAP from 4 to 15 cm H2O.  She is now referred to sleep medicine to establish care for treatment of her obstructive sleep apnea  She is doing well with her PAP device and thinks that she has gotten used to it.  She tolerates the mask and feels the pressure is adequate.  She has struggled some this week due to having COVID 19 and has a stuffy nose. Since going on PAP she feels more rested in the am and has no significant daytime sleepiness.  She denies any nasal dryness or nasal congestion but  does have some mouth dryness.  Her husband says that she is snoring less on the CPAP.    Past Medical History:  Diagnosis Date   Anxiety    Colon polyps    Hypercholesteremia    Past Surgical History:  Procedure Laterality Date   LAPAROSCOPY       Current Meds  Medication Sig   atorvastatin (LIPITOR) 40 MG tablet Take 1 tablet (40 mg total) by mouth daily.   ezetimibe (ZETIA) 10 MG tablet Take 1 tablet (10 mg total) by mouth daily.   fluocinonide-emollient (LIDEX-E) 0.05 % cream Apply 1 Application topically 2 (two) times daily.   meloxicam (MOBIC) 15 MG tablet TAKE 1 TABLET (15 MG TOTAL) BY MOUTH DAILY.   pregabalin (LYRICA) 50 MG capsule 1 CAPSULE BY MOUTH NIGHTLY FOR A WEEK THEN TWICE DAILY FOR A WEEK THEN 2 CAPSULES TWICE DAILY (Patient taking differently: Take 50 mg by mouth daily.)     Allergies:   Gabapentin   Social History   Tobacco Use   Smoking status: Every Day    Packs/day: 0.50    Types: Cigarettes   Smokeless tobacco: Never   Tobacco comments:    also uses nicoteine patch  Vaping Use   Vaping Use: Never used  Substance Use Topics   Alcohol use: Yes    Comment: rarely   Drug use: Not Currently    Types:  Marijuana     Family Hx: The patient's family history includes Alcoholism in her father and mother; Heart attack in her father and mother; Hyperlipidemia in her brother; Osteopenia in her sister; Stroke in her brother.  ROS:   Please see the history of present illness.     All other systems reviewed and are negative.   Prior Sleep studies:   The following studies were reviewed today:  HST and PAP compliance download  Labs/Other Tests and Data Reviewed:     Recent Labs: 12/08/2021: Hemoglobin 15.6; Platelets 287; TSH 2.29 09/28/2022: ALT 27; BUN 21; Creat 0.84; Potassium 5.2; Sodium 144    Wt Readings from Last 3 Encounters:  10/14/22 168 lb (76.2 kg)  10/05/22 166 lb 12.8 oz (75.7 kg)  06/30/22 164 lb 9.6 oz (74.7 kg)     Risk  Assessment/Calculations:          Objective:    Vital Signs:  Ht 5\' 8"  (1.727 m)   Wt 168 lb (76.2 kg)   LMP 04/14/2017   BMI 25.54 kg/m    VITAL SIGNS:  reviewed GEN:  no acute distress EYES:  sclerae anicteric, EOMI - Extraocular Movements Intact RESPIRATORY:  normal respiratory effort, symmetric expansion CARDIOVASCULAR:  no peripheral edema SKIN:  no rash, lesions or ulcers. MUSCULOSKELETAL:  no obvious deformities. NEURO:  alert and oriented x 3, no obvious focal deficit PSYCH:  normal affect  ASSESSMENT & PLAN:    OSA - The patient is tolerating PAP therapy well without any problems. The PAP download performed by his DME was personally reviewed and interpreted by me today and showed an AHI of 1.5 /hr on auto CPAP from 4-15 cm H2O with 93% compliance in using more than 4 hours nightly.  The patient has been using and benefiting from PAP use and will continue to benefit from therapy.    Time:   Today, I have spent 15 minutes with the patient with telehealth technology discussing the above problems.     Medication Adjustments/Labs and Tests Ordered: Current medicines are reviewed at length with the patient today.  Concerns regarding medicines are outlined above.   Tests Ordered: No orders of the defined types were placed in this encounter.   Medication Changes: No orders of the defined types were placed in this encounter.   Follow Up:  In Person in 1 year(s)  Signed, 04/16/2017, MD  10/14/2022 8:57 AM    [facility]

## 2022-10-15 DIAGNOSIS — U071 COVID-19: Secondary | ICD-10-CM | POA: Diagnosis not present

## 2022-10-21 ENCOUNTER — Other Ambulatory Visit (HOSPITAL_COMMUNITY): Payer: Self-pay | Admitting: Surgery

## 2022-10-21 DIAGNOSIS — R590 Localized enlarged lymph nodes: Secondary | ICD-10-CM

## 2022-10-22 NOTE — Progress Notes (Signed)
Judith Big, MD  Caroleen Hamman, NT; P Ir Procedure Requests; Dorise Hiss Denied - this LN does not need a biopsy.  I will attempt to contact Dr. Bedelia Person.  HKM

## 2022-10-24 DIAGNOSIS — G4733 Obstructive sleep apnea (adult) (pediatric): Secondary | ICD-10-CM | POA: Diagnosis not present

## 2022-11-05 ENCOUNTER — Ambulatory Visit: Payer: BC Managed Care – PPO | Admitting: Cardiology

## 2022-11-19 ENCOUNTER — Other Ambulatory Visit: Payer: Self-pay | Admitting: Medical-Surgical

## 2022-11-24 DIAGNOSIS — G4733 Obstructive sleep apnea (adult) (pediatric): Secondary | ICD-10-CM | POA: Diagnosis not present

## 2022-11-30 ENCOUNTER — Other Ambulatory Visit: Payer: Self-pay | Admitting: Cardiology

## 2022-11-30 DIAGNOSIS — E782 Mixed hyperlipidemia: Secondary | ICD-10-CM

## 2022-12-02 ENCOUNTER — Other Ambulatory Visit: Payer: Self-pay | Admitting: Medical-Surgical

## 2022-12-09 DIAGNOSIS — R590 Localized enlarged lymph nodes: Secondary | ICD-10-CM | POA: Diagnosis not present

## 2022-12-09 DIAGNOSIS — R748 Abnormal levels of other serum enzymes: Secondary | ICD-10-CM | POA: Diagnosis not present

## 2022-12-09 IMAGING — CT CT CARDIAC CORONARY ARTERY CALCIUM SCORE
3 series · 14 of 20 positions shown, 16 images · non-contrast
Comparison: Chest two views 08/08/2018

Addendum:
CLINICAL DATA: Risk stratification: 55 Year-old White Female

EXAM:
Coronary Calcium Score
TECHNIQUE: The patient was scanned on a Siemens Force scanner. Axial
non-contrast 3 mm slices were carried out through the heart. The
data set was analyzed on a dedicated work station and scored using
the Agatson method.

[Series 2: cascseq 3.0 sa36 (id) (id) · axial · 0.31mm/px · z∈[-251,-161]mm · 4 of 51 slices shown]
[im 11/51  vessel]
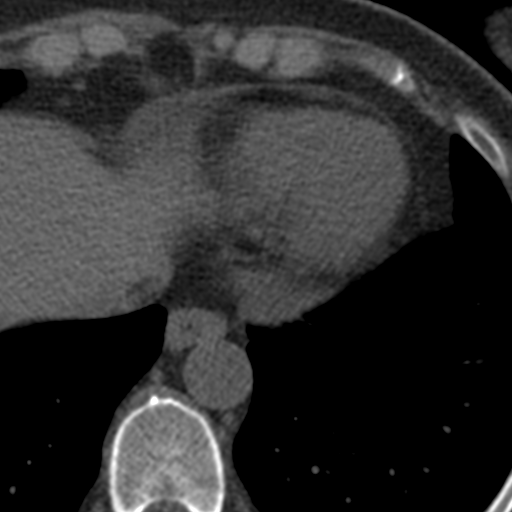
[im 21/51  vessel]
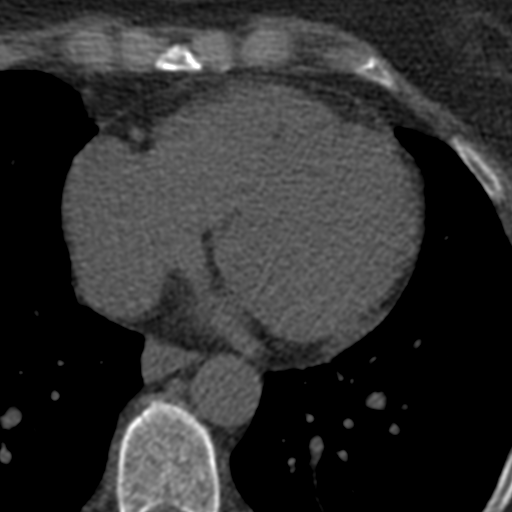
[im 31/51  vessel]
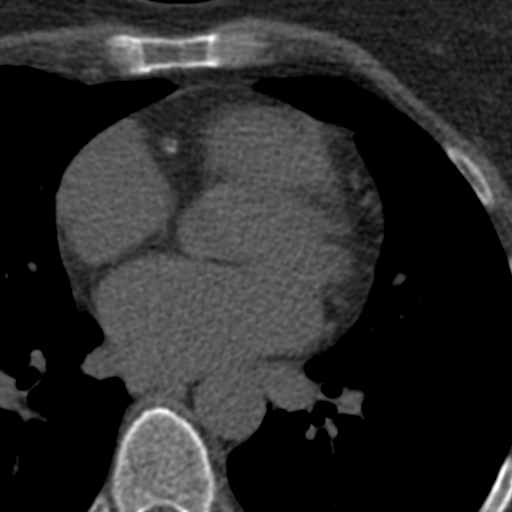
[im 41/51  vessel]
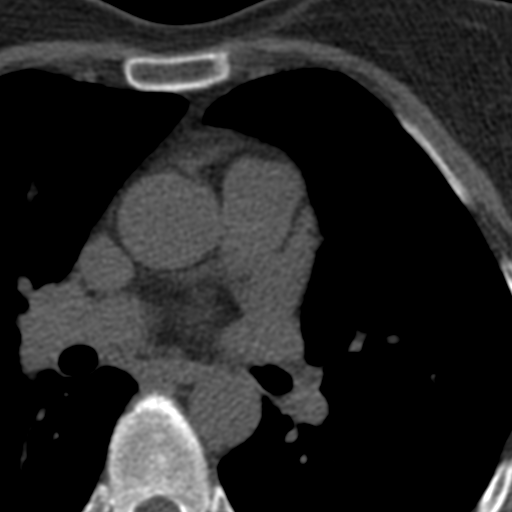

[Series 3: full fov st (id) · axial · 0.56mm/px · z∈[-257,-158]mm · 5 of 51 slices shown, 7 images]
[im 9/51  vessel]
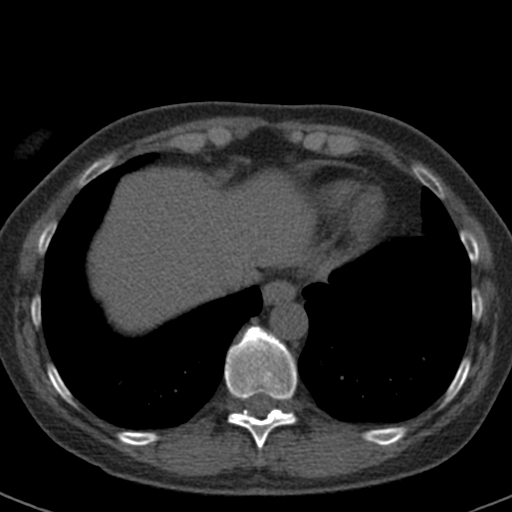
[im 9/51  lung]
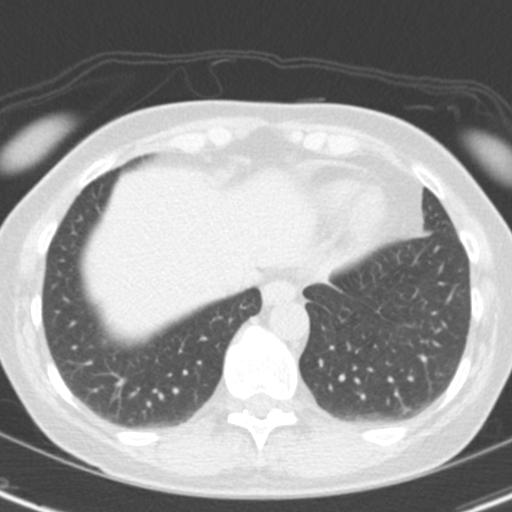
[im 17/51  vessel]
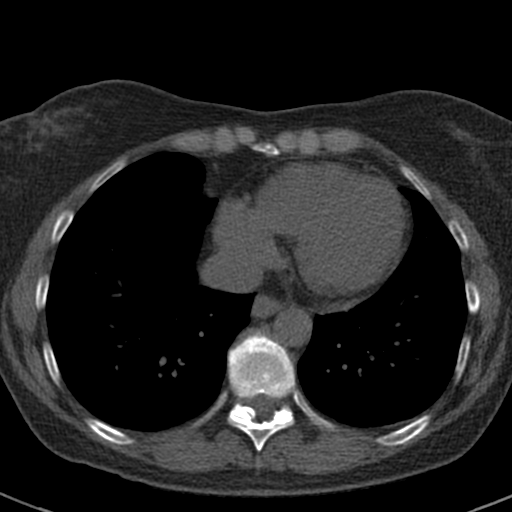
[im 26/51  vessel]
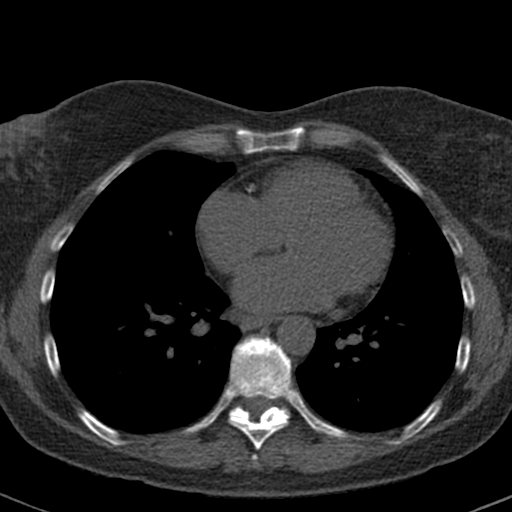
[im 34/51  vessel]
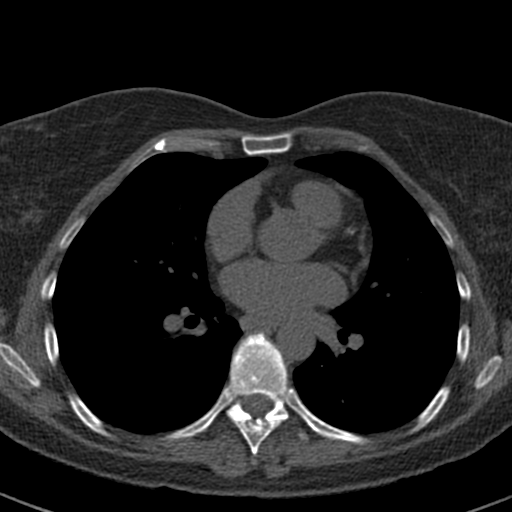
[im 42/51  vessel]
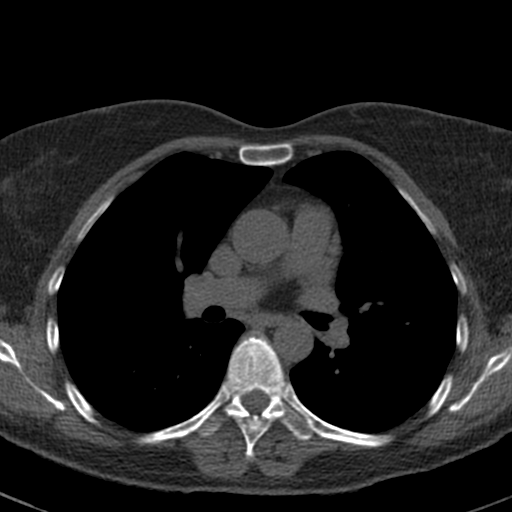
[im 42/51  lung]
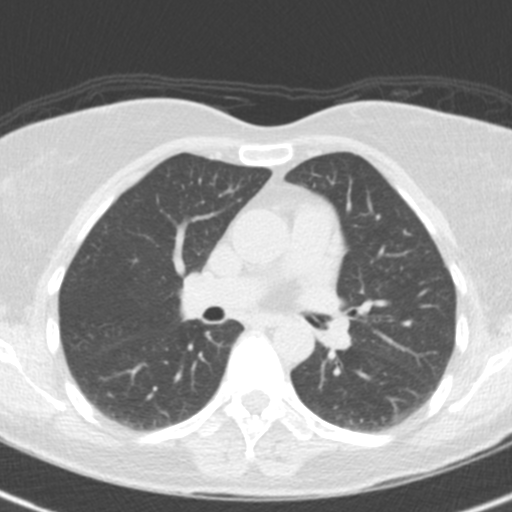

[Series 4: full fov lung · axial · 0.56mm/px · z∈[-257,-158]mm · 5 of 51 slices shown]
[im 9/51  lung]
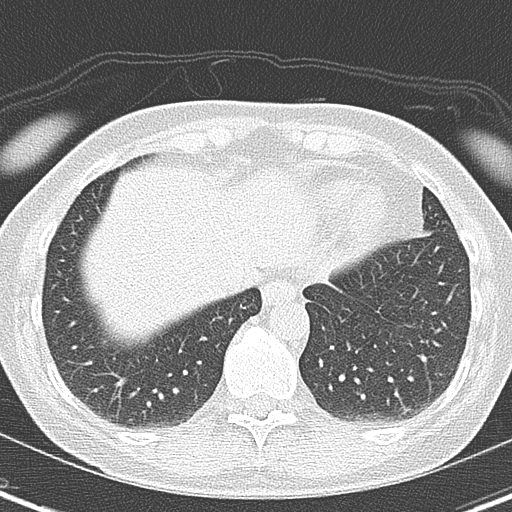
[im 17/51  lung]
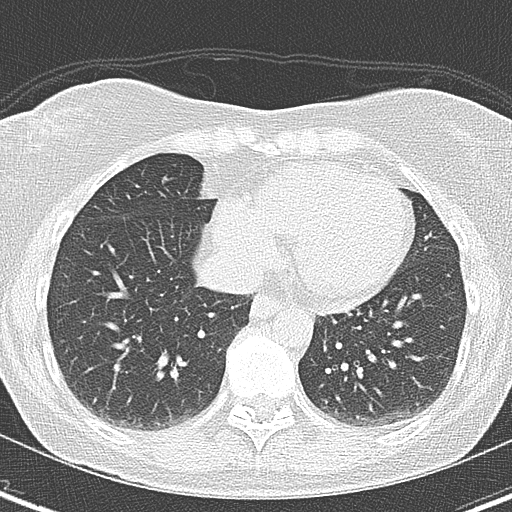
[im 26/51  lung]
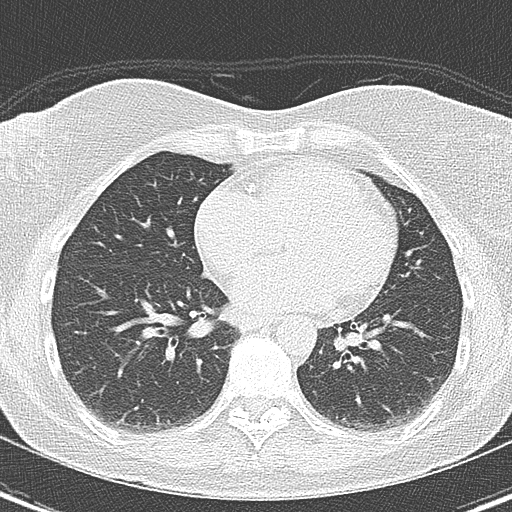
[im 34/51  lung]
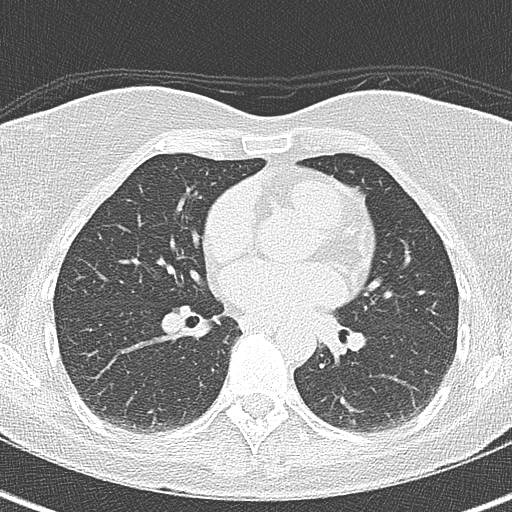
[im 42/51  lung]
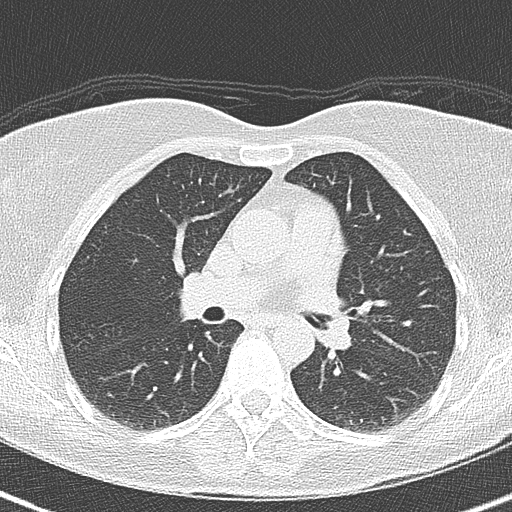

[14 of 20 positions shown; findings below may reference images not displayed]

FINDINGS: Non-cardiac: See separate report from [REDACTED].

Ascending Aorta: Normal caliber.

Pericardium: Normal.

Coronary arteries: Normal origins.

Coronary Calcium Score:

Left main: 0

Left anterior descending artery: 0

Left circumflex artery: 0

Right coronary artery: 3

Total: 3

Percentile: 75th for age, sex, and race matched control.
IMPRESSION: 1. Coronary calcium score of 3. This was 75th percentile for age,
gender, and race matched controls.

RECOMMENDATIONS:



If CAC = 0, it is reasonable to withhold statin therapy and reassess
in 5 to 10 years, as long as higher risk conditions are absent
(diabetes mellitus, family history of premature CHD in first degree
relatives (males <55 years; females <65 years), cigarette smoking,
LDL >=190 mg/dL or other independent risk factors).

If CAC is 1 to 99, it is reasonable to initiate statin therapy for
patients >=55 years of age.

If CAC is >=100 or >=75th percentile, it is reasonable to initiate
statin therapy at any age.

Cardiology referral should be considered for patients with CAC
scores =400 or >=75th percentile.

*8742 AHA/ACC/AACVPR/AAPA/ABC/HAIDTNER/BONILLA/IRINEO/Sako/TUFAN/KIHYEOK/EII LUCAS
Guideline on the Management of Blood Cholesterol: A Report of the
American College of Cardiology/American Heart Association Task Force
on Clinical Practice Guidelines. J Am Coll Cardiol.
5774;73(24):4152-4593.

EXAM:
OVER-READ INTERPRETATION  CT CHEST

The following report is an over-read performed by radiologist Dr.
Girish Quinto [REDACTED] on 02/03/2022. This over-read
does not include interpretation of cardiac or coronary anatomy or
pathology. The coronary calcium score/coronary CTA interpretation by
the cardiologist is attached.
FINDINGS: Cardiovascular: There are no significant extracardiac vascular
findings.

Mediastinum/Nodes: There are no enlarged lymph nodes within the
visualized mediastinum.

Lungs/Pleura: There is no pleural effusion. The visualized lungs
appear clear.

Upper abdomen: No significant findings in the visualized upper
abdomen.

Musculoskeletal/Chest wall: Moderate disc space narrowing throughout
the visualized mid to lower thoracic spine.
IMPRESSION: No significant extracardiac findings within the visualized chest.

*** End of Addendum ***
FINDINGS: Non-cardiac: See separate report from [REDACTED].

Ascending Aorta: Normal caliber.

Pericardium: Normal.

Coronary arteries: Normal origins.

Coronary Calcium Score:

Left main: 0

Left anterior descending artery: 0

Left circumflex artery: 0

Right coronary artery: 3

Total: 3

Percentile: 75th for age, sex, and race matched control.
IMPRESSION: 1. Coronary calcium score of 3. This was 75th percentile for age,
gender, and race matched controls.

RECOMMENDATIONS:



If CAC = 0, it is reasonable to withhold statin therapy and reassess
in 5 to 10 years, as long as higher risk conditions are absent
(diabetes mellitus, family history of premature CHD in first degree
relatives (males <55 years; females <65 years), cigarette smoking,
LDL >=190 mg/dL or other independent risk factors).

If CAC is 1 to 99, it is reasonable to initiate statin therapy for
patients >=55 years of age.

If CAC is >=100 or >=75th percentile, it is reasonable to initiate
statin therapy at any age.

Cardiology referral should be considered for patients with CAC
scores =400 or >=75th percentile.

*8742 AHA/ACC/AACVPR/AAPA/ABC/HAIDTNER/BONILLA/IRINEO/Sako/TUFAN/KIHYEOK/EII LUCAS
Guideline on the Management of Blood Cholesterol: A Report of the
American College of Cardiology/American Heart Association Task Force
on Clinical Practice Guidelines. J Am Coll Cardiol.
5774;73(24):4152-4593.

## 2022-12-10 ENCOUNTER — Other Ambulatory Visit: Payer: Self-pay

## 2022-12-10 ENCOUNTER — Other Ambulatory Visit: Payer: Self-pay | Admitting: Surgery

## 2022-12-10 DIAGNOSIS — R590 Localized enlarged lymph nodes: Secondary | ICD-10-CM

## 2022-12-10 DIAGNOSIS — M5412 Radiculopathy, cervical region: Secondary | ICD-10-CM

## 2022-12-10 LAB — COMPLETE METABOLIC PANEL WITH GFR
AG Ratio: 1.8 (calc) (ref 1.0–2.5)
ALT: 27 U/L (ref 6–29)
AST: 19 U/L (ref 10–35)
Albumin: 4.8 g/dL (ref 3.6–5.1)
Alkaline phosphatase (APISO): 171 U/L — ABNORMAL HIGH (ref 37–153)
BUN: 20 mg/dL (ref 7–25)
CO2: 28 mmol/L (ref 20–32)
Calcium: 10.3 mg/dL (ref 8.6–10.4)
Chloride: 103 mmol/L (ref 98–110)
Creat: 0.8 mg/dL (ref 0.50–1.03)
Globulin: 2.7 g/dL (calc) (ref 1.9–3.7)
Glucose, Bld: 94 mg/dL (ref 65–99)
Potassium: 4.5 mmol/L (ref 3.5–5.3)
Sodium: 141 mmol/L (ref 135–146)
Total Bilirubin: 0.5 mg/dL (ref 0.2–1.2)
Total Protein: 7.5 g/dL (ref 6.1–8.1)
eGFR: 86 mL/min/{1.73_m2} (ref >=60)

## 2022-12-10 LAB — CBC WITH DIFFERENTIAL/PLATELET
Absolute Monocytes: 830 cells/uL (ref 200–950)
Basophils Absolute: 40 cells/uL (ref 0–200)
Basophils Relative: 0.4 %
Eosinophils Absolute: 170 cells/uL (ref 15–500)
Eosinophils Relative: 1.7 %
HCT: 48.8 % — ABNORMAL HIGH (ref 35.0–45.0)
Hemoglobin: 16.6 g/dL — ABNORMAL HIGH (ref 11.7–15.5)
Lymphs Abs: 3100 cells/uL (ref 850–3900)
MCH: 30.8 pg (ref 27.0–33.0)
MCHC: 34 g/dL (ref 32.0–36.0)
MCV: 90.5 fL (ref 80.0–100.0)
MPV: 9.2 fL (ref 7.5–12.5)
Monocytes Relative: 8.3 %
Neutro Abs: 5860 cells/uL (ref 1500–7800)
Neutrophils Relative %: 58.6 %
Platelets: 273 10*3/uL (ref 140–400)
RBC: 5.39 10*6/uL — ABNORMAL HIGH (ref 3.80–5.10)
RDW: 12.9 % (ref 11.0–15.0)
Total Lymphocyte: 31 %
WBC: 10 10*3/uL (ref 3.8–10.8)

## 2022-12-10 LAB — PTH, INTACT AND CALCIUM
Calcium: 10.3 mg/dL (ref 8.6–10.4)
PTH: 19 pg/mL (ref 16–77)

## 2022-12-10 MED ORDER — PREGABALIN 50 MG PO CAPS: 50.0000 mg | ORAL_CAPSULE | Freq: Two times a day (BID) | ORAL | 5 refills | Status: DC

## 2022-12-23 DIAGNOSIS — G4733 Obstructive sleep apnea (adult) (pediatric): Secondary | ICD-10-CM | POA: Diagnosis not present

## 2022-12-27 ENCOUNTER — Other Ambulatory Visit: Payer: Self-pay | Admitting: Cardiology

## 2022-12-27 DIAGNOSIS — E782 Mixed hyperlipidemia: Secondary | ICD-10-CM

## 2023-01-07 ENCOUNTER — Ambulatory Visit (INDEPENDENT_AMBULATORY_CARE_PROVIDER_SITE_OTHER): Payer: BC Managed Care – PPO | Admitting: Medical-Surgical

## 2023-01-07 ENCOUNTER — Encounter: Payer: Self-pay | Admitting: Medical-Surgical

## 2023-01-07 VITALS — BP 107/69 | HR 103 | Resp 20 | Ht 68.0 in | Wt 165.9 lb

## 2023-01-07 DIAGNOSIS — Z0000 Encounter for general adult medical examination without abnormal findings: Secondary | ICD-10-CM

## 2023-01-07 DIAGNOSIS — Z13220 Encounter for screening for lipoid disorders: Secondary | ICD-10-CM

## 2023-01-07 NOTE — Progress Notes (Signed)
Complete physical exam  Patient: Judith [name]   DOB: 09-21-1966   57 y.o. Female  MRN: [id number]  Subjective:    Chief Complaint  Patient presents with   Annual Exam   Judith [name] is a 57 y.o. female who presents today for a complete physical exam. She reports consuming a general diet.  Some light exercises at home.  She generally feels fairly well. She reports sleeping fairly well to poor depending on the week. She does not have additional problems to discuss today.    Most recent fall risk assessment:    01/07/2023    1:27 PM  Fall Risk   Falls in the past year? 0  Number falls in past yr: 0  Injury with Fall? 0  Risk for fall due to : No Fall Risks  Follow up Falls evaluation completed     Most recent depression screenings:    01/07/2023    1:27 PM 06/30/2022    9:14 AM  PHQ 2/9 Scores  PHQ - 2 Score 0 0    Vision:Within last year and Dental: No current dental problems and No regular dental care     Patient Care Team: Samuel Bouche, NP as PCP - General (Nurse Practitioner) Sueanne Margarita, MD as PCP - Sleep Medicine (Cardiology)   Outpatient Medications Prior to Visit  Medication Sig   atorvastatin (LIPITOR) 40 MG tablet TAKE 1 TABLET BY MOUTH EVERY DAY   ezetimibe (ZETIA) 10 MG tablet Take 1 tablet (10 mg total) by mouth daily.   fluocinonide-emollient (LIDEX-E) 0.05 % cream Apply 1 Application topically 2 (two) times daily.   meloxicam (MOBIC) 15 MG tablet TAKE 1 TABLET (15 MG TOTAL) BY MOUTH DAILY.   pregabalin (LYRICA) 50 MG capsule Take 1 capsule (50 mg total) by mouth 2 (two) times daily. (Patient taking differently: Take 50 mg by mouth daily.)   No facility-administered medications prior to visit.    Review of Systems  Constitutional:  Negative for chills, fever, malaise/fatigue and weight loss.  HENT:  Negative for congestion, ear pain, hearing loss, sinus pain and sore throat.   Eyes:  Negative for blurred vision, photophobia and pain.  Respiratory:   Negative for cough, shortness of breath and wheezing.   Cardiovascular:  Negative for chest pain, palpitations and leg swelling.  Gastrointestinal:  Negative for abdominal pain, constipation, diarrhea, heartburn, nausea and vomiting.  Genitourinary:  Negative for dysuria, frequency and urgency.  Musculoskeletal:  Positive for back pain, joint pain and myalgias. Negative for falls and neck pain.  Skin:  Negative for itching and rash.  Neurological:  Negative for dizziness, weakness and headaches.  Endo/Heme/Allergies:  Negative for polydipsia. Does not bruise/bleed easily.  Psychiatric/Behavioral:  Negative for depression, substance abuse and suicidal ideas. The patient is nervous/anxious and has insomnia.      Objective:    BP 107/69 (BP Location: Left Arm, Cuff Size: Normal)   Pulse (!) 103   Resp 20   Ht 5\' 8"  (1.727 m)   Wt 165 lb 14.4 oz (75.3 kg)   LMP 04/14/2017   SpO2 97%   BMI 25.23 kg/m    Physical Exam Constitutional:      General: She is not in acute distress.    Appearance: Normal appearance. She is not ill-appearing.  HENT:     Head: Normocephalic and atraumatic.     Right Ear: Tympanic membrane, ear canal and external ear normal. There is no impacted cerumen.     Left Ear:  Tympanic membrane, ear canal and external ear normal. There is no impacted cerumen.     Nose: Nose normal. No congestion or rhinorrhea.     Mouth/Throat:     Mouth: Mucous membranes are moist.     Pharynx: No oropharyngeal exudate or posterior oropharyngeal erythema.  Eyes:     General: No scleral icterus.       Right eye: No discharge.        Left eye: No discharge.     Extraocular Movements: Extraocular movements intact.     Conjunctiva/sclera: Conjunctivae normal.     Pupils: Pupils are equal, round, and reactive to light.  Neck:     Thyroid: No thyromegaly.     Vascular: No carotid bruit or JVD.     Trachea: Trachea normal.  Cardiovascular:     Rate and Rhythm: Normal rate and  regular rhythm.     Pulses: Normal pulses.     Heart sounds: Normal heart sounds. No murmur heard.    No friction rub. No gallop.  Pulmonary:     Effort: Pulmonary effort is normal. No respiratory distress.     Breath sounds: Normal breath sounds. No wheezing.  Abdominal:     General: Bowel sounds are normal. There is no distension.     Palpations: Abdomen is soft.     Tenderness: There is no abdominal tenderness. There is no guarding.  Musculoskeletal:        General: Normal range of motion.     Cervical back: Normal range of motion and neck supple.  Lymphadenopathy:     Cervical: No cervical adenopathy.  Skin:    General: Skin is warm and dry.  Neurological:     Mental Status: She is alert and oriented to person, place, and time.     Cranial Nerves: No cranial nerve deficit.  Psychiatric:        Mood and Affect: Mood normal.        Behavior: Behavior normal.        Thought Content: Thought content normal.        Judgment: Judgment normal.   No results found for any visits on 01/07/23.     Assessment & Plan:    Routine Health Maintenance and Physical Exam  Immunization History  Administered Date(s) Administered   PFIZER(Purple Top)SARS-COV-2 Vaccination 02/18/2020, 03/10/2020, 11/07/2020   Tdap 10/07/2015    Health Maintenance  Topic Date Due   Zoster Vaccines- Shingrix (1 of 2) Never done   INFLUENZA VACCINE  01/23/2023 (Originally 05/25/2022)   COVID-19 Vaccine (4 - 2023-24 season) 01/23/2023 (Originally 06/25/2022)   COLONOSCOPY (Pts 45-55yrs Insurance coverage will need to be confirmed)  09/26/2023   PAP SMEAR-Modifier  10/07/2023   MAMMOGRAM  09/10/2024   DTaP/Tdap/Td (2 - Td or Tdap) 10/06/2025   Hepatitis C Screening  Completed   HIV Screening  Completed   HPV VACCINES  Aged Out   Discussed health benefits of physical activity, and encouraged her to engage in regular exercise appropriate for her age and condition.  1. Annual physical exam CBC and CMP  recently collected.  Checking lipids today.  Up-to-date on most preventative care however would recommend updating dental exam soon.  Wellness information provided with AVS.  2. Lipid screening Checking lipids.  Return in about 1 year (around 01/07/2024) for annual physical exam.     Samuel Bouche, NP

## 2023-01-08 LAB — LIPID PANEL
Cholesterol: 140 mg/dL (ref <200)
HDL: 41 mg/dL — ABNORMAL LOW (ref >=50)
LDL Cholesterol (Calc): 73 mg/dL (calc)
Non-HDL Cholesterol (Calc): 99 mg/dL (calc) (ref <130)
Total CHOL/HDL Ratio: 3.4 (calc) (ref <5.0)
Triglycerides: 192 mg/dL — ABNORMAL HIGH (ref <150)

## 2023-01-10 ENCOUNTER — Encounter: Payer: Self-pay | Admitting: Medical-Surgical

## 2023-01-14 ENCOUNTER — Ambulatory Visit
Admission: RE | Admit: 2023-01-14 | Discharge: 2023-01-14 | Disposition: A | Discharge status: Home / Self Care | Payer: BC Managed Care – PPO | Source: Ambulatory Visit | Attending: Surgery | Admitting: Surgery

## 2023-01-14 DIAGNOSIS — R590 Localized enlarged lymph nodes: Secondary | ICD-10-CM

## 2023-01-23 DIAGNOSIS — G4733 Obstructive sleep apnea (adult) (pediatric): Secondary | ICD-10-CM | POA: Diagnosis not present

## 2023-02-22 DIAGNOSIS — G4733 Obstructive sleep apnea (adult) (pediatric): Secondary | ICD-10-CM | POA: Diagnosis not present

## 2023-02-23 ENCOUNTER — Other Ambulatory Visit: Payer: Self-pay | Admitting: Medical-Surgical

## 2023-02-23 DIAGNOSIS — G4733 Obstructive sleep apnea (adult) (pediatric): Secondary | ICD-10-CM | POA: Diagnosis not present

## 2023-03-25 DIAGNOSIS — G4733 Obstructive sleep apnea (adult) (pediatric): Secondary | ICD-10-CM | POA: Diagnosis not present

## 2023-03-28 ENCOUNTER — Other Ambulatory Visit: Payer: Self-pay | Admitting: Cardiology

## 2023-03-28 DIAGNOSIS — E782 Mixed hyperlipidemia: Secondary | ICD-10-CM

## 2023-04-23 ENCOUNTER — Other Ambulatory Visit: Payer: Self-pay | Admitting: Cardiology

## 2023-04-23 DIAGNOSIS — E782 Mixed hyperlipidemia: Secondary | ICD-10-CM

## 2023-04-24 DIAGNOSIS — G4733 Obstructive sleep apnea (adult) (pediatric): Secondary | ICD-10-CM | POA: Diagnosis not present

## 2023-05-25 DIAGNOSIS — G4733 Obstructive sleep apnea (adult) (pediatric): Secondary | ICD-10-CM | POA: Diagnosis not present

## 2023-06-28 ENCOUNTER — Other Ambulatory Visit: Payer: Self-pay | Admitting: Medical-Surgical

## 2023-07-11 ENCOUNTER — Other Ambulatory Visit: Payer: Self-pay | Admitting: Sports Medicine

## 2023-07-11 DIAGNOSIS — M5412 Radiculopathy, cervical region: Secondary | ICD-10-CM

## 2023-07-12 NOTE — Telephone Encounter (Signed)
I have not seen this patient in over a year but PCP may consider refilling since she has seen her more recently.  Frankly if the Lyrica is working then Judith [name] does not really need to see me anymore.  I will forward this request to PCP.

## 2023-07-12 NOTE — Telephone Encounter (Signed)
When seen in February, there was a 30-day supply with 5 refills sent.  According to PDMP, she has only filled 3 30-day prescriptions.  Refills should be available at her pharmacy.

## 2023-07-13 MED ORDER — PREGABALIN 50 MG PO CAPS: 50.0000 mg | ORAL_CAPSULE | Freq: Two times a day (BID) | ORAL | 2 refills | Status: AC

## 2023-07-13 NOTE — Telephone Encounter (Signed)
Refill sent.

## 2023-09-14 ENCOUNTER — Other Ambulatory Visit: Payer: Self-pay | Admitting: Medical-Surgical

## 2023-09-14 DIAGNOSIS — Z0000 Encounter for general adult medical examination without abnormal findings: Secondary | ICD-10-CM

## 2023-09-21 ENCOUNTER — Other Ambulatory Visit: Payer: Self-pay | Admitting: Medical-Surgical

## 2023-09-23 ENCOUNTER — Other Ambulatory Visit: Payer: Self-pay | Admitting: Cardiology

## 2023-09-23 DIAGNOSIS — E782 Mixed hyperlipidemia: Secondary | ICD-10-CM

## 2023-09-28 ENCOUNTER — Other Ambulatory Visit: Payer: Self-pay | Admitting: Medical-Surgical

## 2023-10-03 MED ORDER — MELOXICAM 15 MG PO TABS: 15.0000 mg | ORAL_TABLET | Freq: Every day | ORAL | 0 refills | Status: DC

## 2023-10-13 ENCOUNTER — Ambulatory Visit
Admission: RE | Admit: 2023-10-13 | Discharge: 2023-10-13 | Disposition: A | Discharge status: Home / Self Care | Payer: BC Managed Care – PPO | Source: Ambulatory Visit | Attending: Medical-Surgical | Admitting: Medical-Surgical

## 2023-10-13 DIAGNOSIS — Z0000 Encounter for general adult medical examination without abnormal findings: Secondary | ICD-10-CM

## 2023-10-13 DIAGNOSIS — Z1231 Encounter for screening mammogram for malignant neoplasm of breast: Secondary | ICD-10-CM | POA: Diagnosis not present

## 2024-03-24 ENCOUNTER — Other Ambulatory Visit: Payer: Self-pay | Admitting: Cardiology

## 2024-03-24 DIAGNOSIS — E782 Mixed hyperlipidemia: Secondary | ICD-10-CM

## 2024-03-26 ENCOUNTER — Other Ambulatory Visit: Payer: Self-pay | Admitting: Cardiology

## 2024-03-26 DIAGNOSIS — E782 Mixed hyperlipidemia: Secondary | ICD-10-CM

## 2024-03-26 MED ORDER — ATORVASTATIN CALCIUM 40 MG PO TABS: 40.0000 mg | ORAL_TABLET | Freq: Every day | ORAL | 0 refills | Status: DC

## 2024-04-17 ENCOUNTER — Encounter: Payer: Self-pay | Admitting: Urgent Care

## 2024-04-17 ENCOUNTER — Ambulatory Visit: Admitting: Urgent Care

## 2024-04-17 VITALS — BP 124/78 | HR 99 | Resp 18 | Ht 68.0 in | Wt 164.8 lb

## 2024-04-17 DIAGNOSIS — L03115 Cellulitis of right lower limb: Secondary | ICD-10-CM

## 2024-04-17 DIAGNOSIS — Z91038 Other insect allergy status: Secondary | ICD-10-CM

## 2024-04-17 DIAGNOSIS — R600 Localized edema: Secondary | ICD-10-CM

## 2024-04-17 MED ORDER — METHYLPREDNISOLONE ACETATE 80 MG/ML IJ SUSP: 80.0000 mg | Freq: Once | INTRAMUSCULAR | Status: DC

## 2024-04-17 MED ORDER — METHYLPREDNISOLONE ACETATE 40 MG/ML IJ SUSP
40.0000 mg | Freq: Once | INTRAMUSCULAR | Status: AC
  Administered 2024-04-17: 40 mg via INTRAMUSCULAR

## 2024-04-17 MED ORDER — PREDNISONE 10 MG (21) PO TBPK: ORAL_TABLET | Freq: Every day | ORAL | 0 refills | Status: DC

## 2024-04-17 MED ORDER — HYDROXYZINE PAMOATE 25 MG PO CAPS: 25.0000 mg | ORAL_CAPSULE | Freq: Four times a day (QID) | ORAL | 0 refills | Status: DC | PRN

## 2024-04-17 NOTE — Patient Instructions (Addendum)
 You were given a steroid shot here in office. Take the prednisone  taper as directed; take with breakfast to prevent insomnia at night. Use the hydroxyzine as needed every 6-8 hours to help with itching; take with caution as it can make you feel tired.  Monitor for any continued redness or swelling.

## 2024-04-17 NOTE — Progress Notes (Unsigned)
 Established Patient Office Visit  Subjective:  Patient ID: Judith [name], female    DOB: October 17, 1966  Age: 58 y.o. MRN: [id number]  Chief Complaint  Patient presents with   Insect Bite    Pt states she is pretty sure it was a bee but not entirely sure. Happen yesterday right foot painful and swollen     58yo female presents today due to a R swollen foot. States she was bitten by something yesterday, and by evening the foot was swollen. The initial bite mark is located over the R great toe IP joint. Reports gradual swelling primarily to the dorsal aspect of foot, gradually worsening. Has not extended past ankle. No warmth, discharge or drainage. She has not tried any treatments. She denies any systemic sx, most namely no swelling of throat or tongue, shortness of breath, cough or wheezing.     Patient Active Problem List   Diagnosis Date Noted   OSA (obstructive sleep apnea) 07/05/2022   Bilateral dry eyes 12/09/2021   Radiculitis of left cervical region 08/27/2021   Primary insomnia 11/12/2020   Chronic pain of right knee 11/12/2020   Left lateral epicondylitis 12/28/2019   Chronic low back pain 11/16/2019   Leukocytosis 10/30/2019   Elevated hemoglobin (HCC) 10/30/2019   Tobacco abuse 06/13/2017   Gastroesophageal reflux disease without esophagitis 05/30/2017   Mixed hyperlipidemia 05/30/2017   Past Medical History:  Diagnosis Date   Anxiety    Colon polyps    Hypercholesteremia    Past Surgical History:  Procedure Laterality Date   LAPAROSCOPY     Social History   Tobacco Use   Smoking status: Every Day    Current packs/day: 0.50    Types: Cigarettes   Smokeless tobacco: Never   Tobacco comments:    also uses nicoteine patch  Vaping Use   Vaping status: Never Used  Substance Use Topics   Alcohol use: Yes    Comment: rarely   Drug use: Not Currently    Types: Marijuana      ROS: as noted in HPI  Objective:     BP 124/78 (BP Location: Left Arm, Patient  Position: Sitting, Cuff Size: Normal)   Pulse 99   Resp 18   Ht 5' 8 (1.727 m)   Wt 164 lb 12 oz (74.7 kg)   LMP 04/14/2017   SpO2 99%   BMI 25.05 kg/m  BP Readings from Last 3 Encounters:  04/17/24 124/78  01/07/23 107/69  10/05/22 130/73   Wt Readings from Last 3 Encounters:  04/17/24 164 lb 12 oz (74.7 kg)  01/07/23 165 lb 14.4 oz (75.3 kg)  10/14/22 168 lb (76.2 kg)      Physical Exam Vitals and nursing note reviewed.  Constitutional:      General: She is not in acute distress.    Appearance: Normal appearance. She is not ill-appearing, toxic-appearing or diaphoretic.   Cardiovascular:     Rate and Rhythm: Normal rate.     Pulses: Normal pulses.  Pulmonary:     Effort: Pulmonary effort is normal. No respiratory distress.   Musculoskeletal:       Feet:   Skin:    General: Skin is warm and dry.     Findings: Erythema present.   Neurological:     General: No focal deficit present.     Mental Status: She is alert and oriented to person, place, and time.     Sensory: No sensory deficit.      No  results found for any visits on 04/17/24.  Last CBC Lab Results  Component Value Date   WBC 10.0 12/09/2022   HGB 16.6 (H) 12/09/2022   HCT 48.8 (H) 12/09/2022   MCV 90.5 12/09/2022   MCH 30.8 12/09/2022   RDW 12.9 12/09/2022   PLT 273 12/09/2022   Last metabolic panel Lab Results  Component Value Date   GLUCOSE 94 12/09/2022   NA 141 12/09/2022   K 4.5 12/09/2022   CL 103 12/09/2022   CO2 28 12/09/2022   BUN 20 12/09/2022   CREATININE 0.80 12/09/2022   EGFR 86 12/09/2022   CALCIUM  10.3 12/09/2022   CALCIUM  10.3 12/09/2022   PROT 7.5 12/09/2022   ALBUMIN 4.6 03/30/2022   BILITOT 0.5 12/09/2022   ALKPHOS 175 (H) 03/30/2022   AST 19 12/09/2022   ALT 27 12/09/2022   ANIONGAP 8 04/28/2017      The 10-year ASCVD risk score (Arnett DK, et al., 2019) is: 5.6%  Assessment & Plan:  Allergic reaction to insect bite -     predniSONE ; Take by mouth  daily. Take 6 tabs by mouth daily  for 1 days, then 5 tabs for 1 days, then 4 tabs for 1 days, then 3 tabs for 1 days, 2 tabs for 1 days, then 1 tab by mouth daily for 1 days  Dispense: 21 tablet; Refill: 0 -     hydrOXYzine Pamoate; Take 1 capsule (25 mg total) by mouth every 6 (six) hours as needed for itching. Sedation precaution  Dispense: 20 capsule; Refill: 0 -     methylPREDNISolone Acetate -     methylPREDNISolone Acetate  Edema of right foot -     predniSONE ; Take by mouth daily. Take 6 tabs by mouth daily  for 1 days, then 5 tabs for 1 days, then 4 tabs for 1 days, then 3 tabs for 1 days, 2 tabs for 1 days, then 1 tab by mouth daily for 1 days  Dispense: 21 tablet; Refill: 0   Pt with edema to R foot secondary to what I suspect to be a fire ant bite. There is no stinger, no break in the skin, no signs of cellulitis. This appears to be a localized allergic reaction, no systemic symptoms are noted. Will do IM depo-medrol today, then prednisone  taper starting tomorrow. PRN atarax as needed for itching, side effect of sedation discussed.   No follow-ups on file.   Benton LITTIE Gave, PA

## 2024-04-18 ENCOUNTER — Other Ambulatory Visit: Payer: Self-pay | Admitting: Cardiology

## 2024-04-18 DIAGNOSIS — E782 Mixed hyperlipidemia: Secondary | ICD-10-CM

## 2024-04-18 MED ORDER — CEPHALEXIN 500 MG PO CAPS: 500.0000 mg | ORAL_CAPSULE | Freq: Four times a day (QID) | ORAL | 0 refills | Status: AC

## 2024-05-14 NOTE — Progress Notes (Signed)
 HPI: FU palpitations. Monitor March 2023 showed sinus bradycardia, normal sinus rhythm, sinus tachycardia, occasional PAC and PVC and Mobitz 1 second-degree AV block for 1 beat at 2:07 PM and 3:55 AM.  She feels as though she was likely taking a nap at 207 and was asleep at 3:55 AM.  She does snore.  Calcium  score April 2023 3 which was 75th percentile.  Echocardiogram April 2023 showed normal LV function, grade 1 diastolic dysfunction, trace aortic insufficiency. Since last seen, patient denies dyspnea, chest pain or syncope.  Occasional brief flutters but no sustained palpitations.  Current Outpatient Medications  Medication Sig Dispense Refill   atorvastatin  (LIPITOR) 40 MG tablet Take 1 tablet (40 mg total) by mouth daily. 30 tablet 0   ezetimibe  (ZETIA ) 10 MG tablet Take 1 tablet (10 mg total) by mouth daily. Patient must schedule annual visit for further refills second attempt 15 tablet 0   fluocinonide -emollient (LIDEX -E) 0.05 % cream Apply 1 Application topically 2 (two) times daily. 30 g 1   hydrOXYzine  (VISTARIL ) 25 MG capsule Take 1 capsule (25 mg total) by mouth every 6 (six) hours as needed for itching. Sedation precaution 20 capsule 0   meloxicam  (MOBIC ) 15 MG tablet TAKE 1 TABLET (15 MG TOTAL) BY MOUTH DAILY. NEEDS APPOINTMENT FOR FURTHER REFILLS. 15 tablet 0   meloxicam  (MOBIC ) 15 MG tablet Take 1 tablet (15 mg total) by mouth daily. 30 tablet 0   predniSONE  (STERAPRED UNI-PAK 21 TAB) 10 MG (21) TBPK tablet Take by mouth daily. Take 6 tabs by mouth daily  for 1 days, then 5 tabs for 1 days, then 4 tabs for 1 days, then 3 tabs for 1 days, 2 tabs for 1 days, then 1 tab by mouth daily for 1 days 21 tablet 0   pregabalin  (LYRICA ) 50 MG capsule Take 1 capsule (50 mg total) by mouth 2 (two) times daily. 60 capsule 2   No current facility-administered medications for this visit.     Past Medical History:  Diagnosis Date   Anxiety    Colon polyps    Hypercholesteremia     Past  Surgical History:  Procedure Laterality Date   LAPAROSCOPY      Social History   Socioeconomic History   Marital status: Married    Spouse name: Reyes Kloth   Number of children: 3   Years of education: Not on file   Highest education level: Not on file  Occupational History    Employer: [employer]   Tobacco Use   Smoking status: Every Day    Current packs/day: 0.50    Types: Cigarettes   Smokeless tobacco: Never   Tobacco comments:    also uses nicoteine patch  Vaping Use   Vaping status: Never Used  Substance and Sexual Activity   Alcohol use: Yes    Comment: rarely   Drug use: Not Currently    Types: Marijuana   Sexual activity: Yes    Partners: Male    Birth control/protection: Post-menopausal  Other Topics Concern   Not on file  Social History Narrative   Not on file   Social Drivers of Health   Financial Resource Strain: Not on file  Food Insecurity: Not on file  Transportation Needs: Not on file  Physical Activity: Not on file  Stress: Not on file  Social Connections: Unknown (03/08/2022)   Received from [facility]   Social Network    Social Network: Not on file  Intimate Partner Violence:  Unknown (01/28/2022)   Received from [facility]   HITS    Physically Hurt: Not on file    Insult or Talk Down To: Not on file    Threaten Physical Harm: Not on file    Scream or Curse: Not on file    Family History  Problem Relation Age of Onset   Heart attack Mother    Alcoholism Mother    Heart attack Father    Alcoholism Father    Hyperlipidemia Brother    Stroke Brother    Osteopenia Sister     ROS: no fevers or chills, productive cough, hemoptysis, dysphasia, odynophagia, melena, hematochezia, dysuria, hematuria, rash, seizure activity, orthopnea, PND, pedal edema, claudication. Remaining systems are negative.  Physical Exam: Well-developed well-nourished in no acute distress.  Skin is warm and dry.  HEENT is normal.  Neck is supple.  Chest  is clear to auscultation with normal expansion.  Cardiovascular exam is regular rate and rhythm.  Abdominal exam nontender or distended. No masses palpated. Extremities show no edema. neuro grossly intact  EKG Interpretation Date/Time:  Tuesday May 15 2024 08:54:49 EDT Ventricular Rate:  94 PR Interval:  216 QRS Duration:  78 QT Interval:  360 QTC Calculation: 450 R Axis:   75  Text Interpretation: Sinus rhythm with 1st degree A-V block Confirmed by Pietro Rogue ([id number]) on 05/15/2024 8:56:51 AM    A/P  1 palpitations-previously did not add beta-blocker due to Mobitz 1 second-degree AV block.  However this was likely secondary to sleep apnea.  Can consider addition in the future if her symptoms worsen.  2 sleep apnea-continue CPAP.  3 history of orthostasis-symptoms have improved compared to previous.  Continue to stressed fluid intake and increase sodium intake.  4 hyperlipidemia-continue Lipitor and Zetia .  Lipids and liver monitored by primary care.  5 minimally elevated calcium  score-continue statin.  6 tobacco abuse-patient again counseled on discontinuing.  Rogue Pietro, MD

## 2024-05-15 ENCOUNTER — Encounter: Payer: Self-pay | Admitting: Cardiology

## 2024-05-15 ENCOUNTER — Ambulatory Visit: Attending: Cardiology | Admitting: Cardiology

## 2024-05-15 VITALS — BP 116/80 | HR 94 | Ht 68.0 in | Wt 160.0 lb

## 2024-05-15 DIAGNOSIS — I2510 Atherosclerotic heart disease of native coronary artery without angina pectoris: Secondary | ICD-10-CM | POA: Diagnosis not present

## 2024-05-15 DIAGNOSIS — E782 Mixed hyperlipidemia: Secondary | ICD-10-CM

## 2024-05-15 DIAGNOSIS — R002 Palpitations: Secondary | ICD-10-CM | POA: Diagnosis not present

## 2024-05-15 DIAGNOSIS — Z720 Tobacco use: Secondary | ICD-10-CM | POA: Diagnosis not present

## 2024-05-15 MED ORDER — EZETIMIBE 10 MG PO TABS: 10.0000 mg | ORAL_TABLET | Freq: Every day | ORAL | 3 refills | Status: AC

## 2024-05-15 MED ORDER — ATORVASTATIN CALCIUM 40 MG PO TABS: 40.0000 mg | ORAL_TABLET | Freq: Every day | ORAL | 3 refills | Status: AC

## 2024-05-15 NOTE — Addendum Note (Signed)
 Addended by: Dashonda Bonneau W on: 05/15/2024 09:11 AM   Modules accepted: Orders

## 2024-05-15 NOTE — Patient Instructions (Signed)

## 2024-05-24 ENCOUNTER — Ambulatory Visit (INDEPENDENT_AMBULATORY_CARE_PROVIDER_SITE_OTHER): Admitting: Medical-Surgical

## 2024-05-24 VITALS — BP 120/85 | HR 88 | Resp 20 | Ht 68.0 in | Wt 162.1 lb

## 2024-05-24 DIAGNOSIS — G4733 Obstructive sleep apnea (adult) (pediatric): Secondary | ICD-10-CM | POA: Diagnosis not present

## 2024-05-24 DIAGNOSIS — E782 Mixed hyperlipidemia: Secondary | ICD-10-CM

## 2024-05-24 DIAGNOSIS — Z0000 Encounter for general adult medical examination without abnormal findings: Secondary | ICD-10-CM | POA: Diagnosis not present

## 2024-05-24 DIAGNOSIS — D582 Other hemoglobinopathies: Secondary | ICD-10-CM

## 2024-05-24 DIAGNOSIS — Z720 Tobacco use: Secondary | ICD-10-CM

## 2024-05-24 DIAGNOSIS — K219 Gastro-esophageal reflux disease without esophagitis: Secondary | ICD-10-CM | POA: Diagnosis not present

## 2024-05-24 DIAGNOSIS — M5412 Radiculopathy, cervical region: Secondary | ICD-10-CM

## 2024-05-24 DIAGNOSIS — M5030 Other cervical disc degeneration, unspecified cervical region: Secondary | ICD-10-CM

## 2024-05-24 NOTE — Progress Notes (Signed)
 Complete physical exam  Patient: Judith [name]   DOB: 01-May-1966   58 y.o. Female  MRN: [id number]  Subjective:    Chief Complaint  Patient presents with   Annual Exam    Judith [name] is a 58 y.o. female who presents today for a complete physical exam. She reports consuming a general diet. The patient does not participate in regular exercise at present. She generally feels well. She reports sleeping poorly. She does not have additional problems to discuss today.    Most recent fall risk assessment:    05/24/2024   10:19 AM  Fall Risk   Falls in the past year? 1  Number falls in past yr: 1  Injury with Fall? 0  Risk for fall due to : History of fall(s)  Follow up Falls evaluation completed     Most recent depression screenings:    05/24/2024   10:20 AM 01/07/2023    1:27 PM  PHQ 2/9 Scores  PHQ - 2 Score 0 0  PHQ- 9 Score 5     Vision:Within last year and Dental: No current dental problems and No regular dental care     Patient Care Team: Willo Mini, NP as PCP - General (Nurse Practitioner) Shlomo Wilbert SAUNDERS, MD as PCP - Sleep Medicine (Cardiology) Pietro Redell RAMAN, MD as PCP - Cardiology (Cardiology)   Outpatient Medications Prior to Visit  Medication Sig   atorvastatin  (LIPITOR) 40 MG tablet Take 1 tablet (40 mg total) by mouth daily.   ezetimibe  (ZETIA ) 10 MG tablet Take 1 tablet (10 mg total) by mouth daily.   pregabalin  (LYRICA ) 50 MG capsule Take 1 capsule (50 mg total) by mouth 2 (two) times daily.   [DISCONTINUED] fluocinonide -emollient (LIDEX -E) 0.05 % cream Apply 1 Application topically 2 (two) times daily.   [DISCONTINUED] meloxicam  (MOBIC ) 15 MG tablet TAKE 1 TABLET (15 MG TOTAL) BY MOUTH DAILY. NEEDS APPOINTMENT FOR FURTHER REFILLS.   [DISCONTINUED] hydrOXYzine  (VISTARIL ) 25 MG capsule Take 1 capsule (25 mg total) by mouth every 6 (six) hours as needed for itching. Sedation precaution   [DISCONTINUED] meloxicam  (MOBIC ) 15 MG tablet Take 1 tablet (15 mg total)  by mouth daily.   [DISCONTINUED] predniSONE  (STERAPRED UNI-PAK 21 TAB) 10 MG (21) TBPK tablet Take by mouth daily. Take 6 tabs by mouth daily  for 1 days, then 5 tabs for 1 days, then 4 tabs for 1 days, then 3 tabs for 1 days, 2 tabs for 1 days, then 1 tab by mouth daily for 1 days   No facility-administered medications prior to visit.    Review of Systems  Constitutional:  Negative for chills, fever, malaise/fatigue and weight loss.  HENT:  Negative for congestion, ear pain, hearing loss, sinus pain and sore throat.   Eyes:  Negative for blurred vision, photophobia and pain.  Respiratory:  Negative for cough, shortness of breath and wheezing.   Cardiovascular:  Negative for chest pain, palpitations and leg swelling.  Gastrointestinal:  Negative for abdominal pain, constipation, diarrhea, heartburn, nausea and vomiting.  Genitourinary:  Negative for dysuria, frequency and urgency.  Musculoskeletal:  Positive for joint pain and myalgias. Negative for falls and neck pain.  Skin:  Negative for itching and rash.  Neurological:  Positive for tingling. Negative for dizziness, weakness and headaches.  Endo/Heme/Allergies:  Positive for polydipsia. Does not bruise/bleed easily.  Psychiatric/Behavioral:  Negative for depression, substance abuse and suicidal ideas. The patient is nervous/anxious and has insomnia.      Objective:  BP 120/85 (BP Location: Left Arm, Cuff Size: Normal)   Pulse 88   Resp 20   Ht 5' 8 (1.727 m)   Wt 162 lb 1.9 oz (73.5 kg)   LMP 04/14/2017   SpO2 99%   BMI 24.65 kg/m    Physical Exam Vitals reviewed.  Constitutional:      General: She is not in acute distress.    Appearance: Normal appearance. She is not ill-appearing.  HENT:     Head: Normocephalic and atraumatic.     Right Ear: Tympanic membrane, ear canal and external ear normal. There is no impacted cerumen.     Left Ear: Tympanic membrane, ear canal and external ear normal. There is no impacted  cerumen.     Nose: Nose normal. No congestion or rhinorrhea.     Mouth/Throat:     Mouth: Mucous membranes are moist.     Pharynx: No oropharyngeal exudate or posterior oropharyngeal erythema.  Eyes:     General: No scleral icterus.       Right eye: No discharge.        Left eye: No discharge.     Extraocular Movements: Extraocular movements intact.     Conjunctiva/sclera: Conjunctivae normal.     Pupils: Pupils are equal, round, and reactive to light.  Neck:     Thyroid : No thyromegaly.     Vascular: No carotid bruit or JVD.     Trachea: Trachea normal.  Cardiovascular:     Rate and Rhythm: Normal rate and regular rhythm.     Pulses: Normal pulses.     Heart sounds: Normal heart sounds. No murmur heard.    No friction rub. No gallop.  Pulmonary:     Effort: Pulmonary effort is normal. No respiratory distress.     Breath sounds: Normal breath sounds. No wheezing.  Abdominal:     General: Bowel sounds are normal. There is no distension.     Palpations: Abdomen is soft.     Tenderness: There is no abdominal tenderness. There is no guarding.  Musculoskeletal:        General: Normal range of motion.     Cervical back: Normal range of motion and neck supple.  Lymphadenopathy:     Cervical: No cervical adenopathy.  Skin:    General: Skin is warm and dry.  Neurological:     Mental Status: She is alert and oriented to person, place, and time.     Cranial Nerves: No cranial nerve deficit.  Psychiatric:        Mood and Affect: Mood normal.        Behavior: Behavior normal.        Thought Content: Thought content normal.        Judgment: Judgment normal.      No results found for any visits on 05/24/24.     Assessment & Plan:    Routine Health Maintenance and Physical Exam  Immunization History  Administered Date(s) Administered   PFIZER(Purple Top)SARS-COV-2 Vaccination 02/18/2020, 03/10/2020, 11/07/2020   Tdap 10/07/2015    Health Maintenance  Topic Date Due    Pneumococcal Vaccine: 19-49 Years (1 of 2 - PCV) Never done   Pneumococcal Vaccine: 50+ Years (1 of 2 - PCV) Never done   Hepatitis B Vaccines (1 of 3 - 19+ 3-dose series) Never done   Lung Cancer Screening  Never done   Zoster Vaccines- Shingrix (1 of 2) Never done   Colonoscopy  09/26/2023   COVID-19 Vaccine (4 -  2024-25 season) 06/09/2024 (Originally 06/26/2023)   INFLUENZA VACCINE  05/25/2024   Cervical Cancer Screening (HPV/Pap Cotest)  10/06/2025   DTaP/Tdap/Td (2 - Td or Tdap) 10/06/2025   MAMMOGRAM  10/12/2025   Hepatitis C Screening  Completed   HIV Screening  Completed   HPV VACCINES  Aged Out   Meningococcal B Vaccine  Aged Out    Discussed health benefits of physical activity, and encouraged her to engage in regular exercise appropriate for her age and condition.  1. Annual physical exam (Primary) Checking labs today.  Up-to-date on preventative care but does need to find a new dentist.  Wellness information provided with AVS.  2. Mixed hyperlipidemia Checking lipids and liver function.  Continue atorvastatin  and Zetia . - Lipid panel - CMP14+EGFR  3. OSA (obstructive sleep apnea) Has been on CPAP for the last 2 years which is managed by cardiology.  Not sure who her DME company is at this time and admits that she does need new supplies but is concerned about the cost.  Advised to let me know who her DME company is if she needs a new prescription or help getting supplies.  Consider using [employer] for supplies to see if this is a cheaper option.  4. Gastroesophageal reflux disease without esophagitis Stable with dietary modification.  5. Elevated hemoglobin (HCC) Checking CBC. - CBC with Differential/Platelet  6. Tobacco abuse Has been smoking upwards of a pack a day for approximately 30 years.  She meets the criteria for lung cancer screening.  Discussed recommendations with patient and she is agreeable.  Ordering CT chest for lung cancer screening today. - CT CHEST LUNG  CA SCREEN LOW DOSE W/O CM; Future  7. DDD (degenerative disc disease), cervical 8. Cervical radiculopathy She does have a history of cervical degenerative disc disease as well as cervical radiculopathy.  She had been taking pregabalin  but this has not been helpful for her symptoms.  She was also taking meloxicam  which was also not helpful.  She stopped both medications when she ran out of refills and has not noted a difference in her pain levels.  She had previous workup showing bulging disks, spinal stenosis, and moderate to severe foraminal stenosis a few years ago.  Her symptoms remain and are impeding her daily activities.  She is interested in further workup or options for treatment.  She did undergo an epidural injection which was not helpful.  Advised that she is welcome to return to Dr. Curtis to discuss next steps or we can get her referred over to neurosurgery.  Since neurosurgery will be able to evaluate, diagnose, and treat with procedures, she opted for that.  Referral placed. - Ambulatory referral to Neurosurgery   Return in about 1 year (around 05/24/2025) for annual physical exam or sooner if needed.     Lindley Stachnik, NP

## 2024-05-24 NOTE — Patient Instructions (Signed)
 Preventive Care 16-59 Years Old, Female  Preventive care refers to lifestyle choices and visits with your health care provider that can promote health and wellness. Preventive care visits are also called wellness exams.  What can I expect for my preventive care visit?  Counseling  Your health care provider may ask you questions about your:  Medical history, including:  Past medical problems.  Family medical history.  Pregnancy history.  Current health, including:  Menstrual cycle.  Method of birth control.  Emotional well-being.  Home life and relationship well-being.  Sexual activity and sexual health.  Lifestyle, including:  Alcohol, nicotine or tobacco, and drug use.  Access to firearms.  Diet, exercise, and sleep habits.  Work and work Astronomer.  Sunscreen use.  Safety issues such as seatbelt and bike helmet use.  Physical exam  Your health care provider will check your:  Height and weight. These may be used to calculate your BMI (body mass index). BMI is a measurement that tells if you are at a healthy weight.  Waist circumference. This measures the distance around your waistline. This measurement also tells if you are at a healthy weight and may help predict your risk of certain diseases, such as type 2 diabetes and high blood pressure.  Heart rate and blood pressure.  Body temperature.  Skin for abnormal spots.  What immunizations do I need?    Vaccines are usually given at various ages, according to a schedule. Your health care provider will recommend vaccines for you based on your age, medical history, and lifestyle or other factors, such as travel or where you work.  What tests do I need?  Screening  Your health care provider may recommend screening tests for certain conditions. This may include:  Lipid and cholesterol levels.  Diabetes screening. This is done by checking your blood sugar (glucose) after you have not eaten for a while (fasting).  Pelvic exam and Pap test.  Hepatitis B test.  Hepatitis C  test.  HIV (human immunodeficiency virus) test.  STI (sexually transmitted infection) testing, if you are at risk.  Lung cancer screening.  Colorectal cancer screening.  Mammogram. Talk with your health care provider about when you should start having regular mammograms. This may depend on whether you have a family history of breast cancer.  BRCA-related cancer screening. This may be done if you have a family history of breast, ovarian, tubal, or peritoneal cancers.  Bone density scan. This is done to screen for osteoporosis.  Talk with your health care provider about your test results, treatment options, and if necessary, the need for more tests.  Follow these instructions at home:  Eating and drinking    Eat a diet that includes fresh fruits and vegetables, whole grains, lean protein, and low-fat dairy products.  Take vitamin and mineral supplements as recommended by your health care provider.  Do not drink alcohol if:  Your health care provider tells you not to drink.  You are pregnant, may be pregnant, or are planning to become pregnant.  If you drink alcohol:  Limit how much you have to 0-1 drink a day.  Know how much alcohol is in your drink. In the [place] one drink equals one 12 oz bottle of beer (355 mL), one 5 oz glass of wine (148 mL), or one 1 oz glass of hard liquor (44 mL).  Lifestyle  Brush your teeth every morning and night with fluoride toothpaste. Floss one time each day.  Exercise for at least  30 minutes 5 or more days each week.  Do not use any products that contain nicotine or tobacco. These products include cigarettes, chewing tobacco, and vaping devices, such as e-cigarettes. If you need help quitting, ask your health care provider.  Do not use drugs.  If you are sexually active, practice safe sex. Use a condom or other form of protection to prevent STIs.  If you do not wish to become pregnant, use a form of birth control. If you plan to become pregnant, see your health care provider for a  prepregnancy visit.  Take aspirin only as told by your health care provider. Make sure that you understand how much to take and what form to take. Work with your health care provider to find out whether it is safe and beneficial for you to take aspirin daily.  Find healthy ways to manage stress, such as:  Meditation, yoga, or listening to music.  Journaling.  Talking to a trusted person.  Spending time with friends and family.  Minimize exposure to UV radiation to reduce your risk of skin cancer.  Safety  Always wear your seat belt while driving or riding in a vehicle.  Do not drive:  If you have been drinking alcohol. Do not ride with someone who has been drinking.  When you are tired or distracted.  While texting.  If you have been using any mind-altering substances or drugs.  Wear a helmet and other protective equipment during sports activities.  If you have firearms in your house, make sure you follow all gun safety procedures.  Seek help if you have been physically or sexually abused.  What's next?  Visit your health care provider once a year for an annual wellness visit.  Ask your health care provider how often you should have your eyes and teeth checked.  Stay up to date on all vaccines.  This information is not intended to replace advice given to you by your health care provider. Make sure you discuss any questions you have with your health care provider.  Document Revised: 04/08/2021 Document Reviewed: 04/08/2021  Elsevier Patient Education  2024 [employer].

## 2024-05-25 LAB — CBC WITH DIFFERENTIAL/PLATELET
Basophils Absolute: 0.1 x10E3/uL (ref 0.0–0.2)
Basos: 0 %
EOS (ABSOLUTE): 0.2 x10E3/uL (ref 0.0–0.4)
Eos: 2 %
Hematocrit: 49 % — ABNORMAL HIGH (ref 34.0–46.6)
Hemoglobin: 15.7 g/dL (ref 11.1–15.9)
Immature Grans (Abs): 0 x10E3/uL (ref 0.0–0.1)
Immature Granulocytes: 0 %
Lymphocytes Absolute: 3.4 x10E3/uL — ABNORMAL HIGH (ref 0.7–3.1)
Lymphs: 25 %
MCH: 29.7 pg (ref 26.6–33.0)
MCHC: 32 g/dL (ref 31.5–35.7)
MCV: 93 fL (ref 79–97)
Monocytes Absolute: 1 x10E3/uL — ABNORMAL HIGH (ref 0.1–0.9)
Monocytes: 8 %
Neutrophils Absolute: 8.8 x10E3/uL — ABNORMAL HIGH (ref 1.4–7.0)
Neutrophils: 65 %
Platelets: 290 x10E3/uL (ref 150–450)
RBC: 5.29 x10E6/uL — ABNORMAL HIGH (ref 3.77–5.28)
RDW: 13.7 % (ref 11.7–15.4)
WBC: 13.5 x10E3/uL — ABNORMAL HIGH (ref 3.4–10.8)

## 2024-05-25 LAB — CMP14+EGFR
ALT: 27 IU/L (ref 0–32)
AST: 17 IU/L (ref 0–40)
Albumin: 4.5 g/dL (ref 3.8–4.9)
Alkaline Phosphatase: 228 IU/L — ABNORMAL HIGH (ref 44–121)
BUN/Creatinine Ratio: 18 (ref 9–23)
BUN: 12 mg/dL (ref 6–24)
Bilirubin Total: 0.3 mg/dL (ref 0.0–1.2)
CO2: 21 mmol/L (ref 20–29)
Calcium: 9.9 mg/dL (ref 8.7–10.2)
Chloride: 101 mmol/L (ref 96–106)
Creatinine, Ser: 0.68 mg/dL (ref 0.57–1.00)
Globulin, Total: 2.7 g/dL (ref 1.5–4.5)
Glucose: 90 mg/dL (ref 70–99)
Potassium: 4.3 mmol/L (ref 3.5–5.2)
Sodium: 141 mmol/L (ref 134–144)
Total Protein: 7.2 g/dL (ref 6.0–8.5)
eGFR: 100 mL/min/1.73 (ref >59)

## 2024-05-25 LAB — LIPID PANEL
Chol/HDL Ratio: 2.9 ratio (ref 0.0–4.4)
Cholesterol, Total: 125 mg/dL (ref 100–199)
HDL: 43 mg/dL (ref >39)
LDL Chol Calc (NIH): 60 mg/dL (ref 0–99)
Triglycerides: 125 mg/dL (ref 0–149)
VLDL Cholesterol Cal: 22 mg/dL (ref 5–40)

## 2024-05-28 ENCOUNTER — Ambulatory Visit: Payer: Self-pay | Admitting: Medical-Surgical

## 2024-05-28 DIAGNOSIS — M5412 Radiculopathy, cervical region: Secondary | ICD-10-CM

## 2024-05-28 DIAGNOSIS — M5030 Other cervical disc degeneration, unspecified cervical region: Secondary | ICD-10-CM

## 2024-06-06 ENCOUNTER — Ambulatory Visit (INDEPENDENT_AMBULATORY_CARE_PROVIDER_SITE_OTHER)

## 2024-06-06 DIAGNOSIS — Z122 Encounter for screening for malignant neoplasm of respiratory organs: Secondary | ICD-10-CM

## 2024-06-06 DIAGNOSIS — F17210 Nicotine dependence, cigarettes, uncomplicated: Secondary | ICD-10-CM

## 2024-06-06 DIAGNOSIS — Z720 Tobacco use: Secondary | ICD-10-CM

## 2024-06-21 DIAGNOSIS — M5441 Lumbago with sciatica, right side: Secondary | ICD-10-CM | POA: Diagnosis not present

## 2024-06-21 DIAGNOSIS — M4722 Other spondylosis with radiculopathy, cervical region: Secondary | ICD-10-CM | POA: Diagnosis not present

## 2024-06-26 ENCOUNTER — Encounter: Payer: Self-pay | Admitting: Cardiology

## 2024-06-26 ENCOUNTER — Encounter: Payer: Self-pay | Admitting: Sports Medicine

## 2024-06-26 DIAGNOSIS — M5030 Other cervical disc degeneration, unspecified cervical region: Secondary | ICD-10-CM | POA: Insufficient documentation

## 2024-06-26 DIAGNOSIS — E782 Mixed hyperlipidemia: Secondary | ICD-10-CM

## 2024-06-29 MED ORDER — ASPIRIN 81 MG PO TBEC: 81.0000 mg | DELAYED_RELEASE_TABLET | Freq: Every day | ORAL | Status: AC

## 2024-06-29 NOTE — Telephone Encounter (Signed)
 Spoke with pt, Aware of dr vertie recommendations.  Referral placed for pharm D

## 2024-07-02 ENCOUNTER — Other Ambulatory Visit: Payer: Self-pay | Admitting: Neurosurgery

## 2024-07-02 DIAGNOSIS — M4722 Other spondylosis with radiculopathy, cervical region: Secondary | ICD-10-CM

## 2024-07-02 DIAGNOSIS — M5440 Lumbago with sciatica, unspecified side: Secondary | ICD-10-CM

## 2024-07-08 ENCOUNTER — Other Ambulatory Visit

## 2024-07-08 DIAGNOSIS — M48061 Spinal stenosis, lumbar region without neurogenic claudication: Secondary | ICD-10-CM | POA: Diagnosis not present

## 2024-07-08 DIAGNOSIS — M5440 Lumbago with sciatica, unspecified side: Secondary | ICD-10-CM

## 2024-07-08 DIAGNOSIS — M4722 Other spondylosis with radiculopathy, cervical region: Secondary | ICD-10-CM

## 2024-07-08 DIAGNOSIS — M47812 Spondylosis without myelopathy or radiculopathy, cervical region: Secondary | ICD-10-CM | POA: Diagnosis not present

## 2024-07-08 DIAGNOSIS — M5021 Other cervical disc displacement,  high cervical region: Secondary | ICD-10-CM | POA: Diagnosis not present

## 2024-07-08 DIAGNOSIS — G8929 Other chronic pain: Secondary | ICD-10-CM | POA: Diagnosis not present

## 2024-07-08 DIAGNOSIS — M51369 Other intervertebral disc degeneration, lumbar region without mention of lumbar back pain or lower extremity pain: Secondary | ICD-10-CM | POA: Diagnosis not present

## 2024-07-08 DIAGNOSIS — D1809 Hemangioma of other sites: Secondary | ICD-10-CM | POA: Diagnosis not present

## 2024-07-08 DIAGNOSIS — M4802 Spinal stenosis, cervical region: Secondary | ICD-10-CM | POA: Diagnosis not present

## 2024-07-08 DIAGNOSIS — M549 Dorsalgia, unspecified: Secondary | ICD-10-CM | POA: Diagnosis not present

## 2024-07-18 DIAGNOSIS — M5412 Radiculopathy, cervical region: Secondary | ICD-10-CM | POA: Diagnosis not present

## 2024-07-31 NOTE — Therapy (Unsigned)
 SABRA

## 2024-08-01 ENCOUNTER — Other Ambulatory Visit: Payer: Self-pay

## 2024-08-01 ENCOUNTER — Ambulatory Visit: Attending: Orthopedic Surgery

## 2024-08-01 DIAGNOSIS — M6281 Muscle weakness (generalized): Secondary | ICD-10-CM | POA: Insufficient documentation

## 2024-08-01 DIAGNOSIS — M5412 Radiculopathy, cervical region: Secondary | ICD-10-CM | POA: Diagnosis not present

## 2024-08-01 DIAGNOSIS — R293 Abnormal posture: Secondary | ICD-10-CM | POA: Insufficient documentation

## 2024-08-01 NOTE — Therapy (Cosign Needed Addendum)
 OUTPATIENT PHYSICAL THERAPY CERVICAL EVALUATION   Patient Name: Judith [name] MRN: [id number] DOB:October 10, 1966, 58 y.o., female Today's Date: 08/01/2024  END OF SESSION:  PT End of Session - 08/01/24 1202     Visit Number 1    Number of Visits 17    Date for Recertification  09/12/24    Authorization Type [employer]    PT Start Time 0802    PT Stop Time 0846    PT Time Calculation (min) 44 min    Activity Tolerance Patient tolerated treatment well    Behavior During Therapy [facility] for tasks assessed/performed          Past Medical History:  Diagnosis Date   Anxiety    Colon polyps    Hypercholesteremia    Past Surgical History:  Procedure Laterality Date   LAPAROSCOPY     Patient Active Problem List   Diagnosis Date Noted   DDD (degenerative disc disease), cervical 06/26/2024   OSA (obstructive sleep apnea) 07/05/2022   Bilateral dry eyes 12/09/2021   Cervical radiculopathy 08/27/2021   Primary insomnia 11/12/2020   Chronic pain of right knee 11/12/2020   Left lateral epicondylitis 12/28/2019   Chronic low back pain 11/16/2019   Leukocytosis 10/30/2019   Elevated hemoglobin 10/30/2019   Tobacco abuse 06/13/2017   Gastroesophageal reflux disease without esophagitis 05/30/2017   Mixed hyperlipidemia 05/30/2017    PCP: Zada Palin, NP  REFERRING PROVIDER: Waddell Lager, MD  REFERRING DIAG:    M54.12 (ICD-10-CM) - Radiculopathy, cervical region     THERAPY DIAG:  Radiculopathy, cervical region  Muscle weakness (generalized)  Abnormal posture   Rationale for Evaluation and Treatment: Rehabilitation  ONSET DATE: Chronic with intermittent flareups, recent flareup 3 months ago  SUBJECTIVE:                                                                                                                                                                                                         SUBJECTIVE STATEMENT: Pt reports from her right shoulder to  her fingers it goes numb. Pt denies pain, however rates numbness as having 4/10. Pt states numbess is worse at night, esp into middle finger. She states she is also having low back pain that she plans to be treated later,however her neck/right arm  numbness is her priority.  Hand dominance: Right  PERTINENT HISTORY:  Leukocytosis Chronic right knee pain Chronic LBP Left lateral epicondylitis Cervical radiculopathy DDD Smoker Allergic to gabapentin    PAIN:  Are you having pain? Yes: NPRS scale: 4/10 currently; 6/10 at  worst;worse at night  Pain location: Superior right shoulder down into middle finger  Pain description: like I pulled a muscle feeling , tingling feeling esp into middle finger  Aggravating factors: shoulder abducted position, neck extension  Relieving factors: NSAIDS, TENS unit; tried PT and epidural steroid injection years ago but not recently  PRECAUTIONS: None  RED FLAGS: None     WEIGHT BEARING RESTRICTIONS: No  FALLS:  Has patient fallen in last 6 months? 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   OBJECTIVE:  Note: Objective measures were completed at Evaluation unless otherwise noted.  DIAGNOSTIC FINDINGS:  IMPRESSION: 1. Multilevel cervical spondylosis with resultant mild to moderate spinal stenosis at C4-5 through C6-7. 2. Multifactorial degenerative changes with resultant multilevel foraminal narrowing as above. Notable findings include moderate right C5 foraminal stenosis, severe left worse than right C6 foraminal narrowing, with severe right and moderate left C7 foraminal stenosis. 3. Overall, these changes are mildly progressed  as compared to prior MRI from 10/11/2021.      PATIENT SURVEYS:  NDI:  NECK DISABILITY INDEX  Date: 08/01/24 Score  Pain intensity 2 = The pain is moderate at the moment  2. Personal care (washing, dressing, etc.) 1 =  I can look after myself normally but it causes extra pain  3. Lifting 2 = Pain prevents me lifting heavy weights off the floor, but I can manage if they are  conveniently placed, for example on a table  4. Reading 3 = I can't read as much as I want because of moderate pain in my neck  5. Headaches 0 = I have no headaches at all  6. Concentration 2 = I have a fair degree of difficulty in concentrating when I want to  7. Work 2 = I can do most of my usual work, but no more  8. Driving 1 =  I can drive my car as long as I want with slight pain in my neck  9. Sleeping 3 =  My sleep is moderately disturbed (2-3 hrs sleepless)  10. Recreation 3 = I am able to engage in a few of my usual recreation activities because of pain in   my neck  Total 19/50   Minimum Detectable Change (90% confidence): 5 points or 10% points  COGNITION: Overall cognitive status: Within functional limits for tasks assessed  SENSATION: WFL  POSTURE: rounded shoulders and forward head  PALPATION: No tenderness to bil upper traps or perscap muscles    CERVICAL ROM:   Active ROM A/PROM (deg) eval  Flexion 40  Extension 15 had numbness  Right lateral flexion 35  Left lateral flexion 47  Right rotation 50  Left rotation 64   (Blank rows = not tested)  UPPER EXTREMITY ROM:  Active ROM Right eval Left eval  Shoulder flexion 135 130  Shoulder extension    Shoulder abduction 145 155  Shoulder adduction    Shoulder extension    Shoulder internal rotation 28 70  Shoulder external rotation 38 had numbness 40  Elbow flexion    Elbow extension    Wrist flexion    Wrist extension    Wrist ulnar  deviation    Wrist radial deviation    Wrist pronation    Wrist supination     (Blank rows  = not tested)  UPPER EXTREMITY MMT:  MMT Right eval Left eval  Shoulder flexion 4- 4  Shoulder extension 4- 4  Shoulder abduction 4- 4  Shoulder adduction    Shoulder extension    Shoulder internal rotation 4 4  Shoulder external rotation 4 4  Middle trapezius 3+ 4-  Lower trapezius 3+ 4-  Elbow flexion 4- 4  Elbow extension 4- 4  Wrist flexion 4- 4  Wrist extension 4- 4  Wrist ulnar deviation 4- 4  Wrist radial deviation 4- 4  Wrist pronation    Wrist supination    Grip strength     (Blank rows = not tested)    TREATMENT DATE:   [facility] Adult PT Treatment:                                                DATE: 08/01/24 See HEP   Therapeutic Exercise:  Shoulder AROM retraction x15 Cervical AROM retraction x10 Shoulder abduction with red band x15                                                                                                                                  PATIENT EDUCATION:  Education details: HEP, POC Person educated: Patient Education method: Explanation, Demonstration, Tactile cues, Verbal cues, and Handouts Education comprehension: verbalized understanding, returned demonstration, verbal cues required, tactile cues required, and needs further education  HOME EXERCISE PROGRAM: Access Code: [email or web address] URL: [email or web address] Date: 08/01/2024 Prepared by: Lucie Meeter Exercises - Seated Cervical Retraction  - 3 x daily - 7 x weekly - 2 sets - 10 reps - Supine Shoulder Horizontal Abduction with Resistance  - 1 x daily - 7 x weekly - 2 sets - 10 reps   ASSESSMENT:  CLINICAL IMPRESSION: Patient is a 58 y.o. female who was seen today for physical therapy evaluation and treatment for cervical neck pain. Pt reports she's had ongoing chronic neck pain for several years with intermittent flare ups, her most recent flare up was 3 months ago. Reports right shoulder into middle finger numbness and tingling down C5-C7 nerve root distribution. She  has numbness the most during active right shoulder ER, cervical extension, and right SB. She has moderate difficulty reading, sleeping, and doing recreational activities, and has mild difficulty lifting, concentrating, and working. She has done PT before and it worked, however she admits she's not doing specific exercises. She presents with decreased cervical ROM, decreased periscapular muscle strength, and postural malalignment. Pt will benefit from skilled therapy to address the deficits below and increase functional capacity.   OBJECTIVE IMPAIRMENTS: decreased activity tolerance, decreased coordination, decreased endurance, decreased mobility, decreased ROM, decreased strength,  increased fascial restrictions, increased muscle spasms, impaired flexibility, impaired tone, impaired UE functional use, improper body mechanics, and postural dysfunction.   ACTIVITY LIMITATIONS: carrying, lifting, bending, sleeping, transfers, bed mobility, bathing, toileting, dressing, reach over head, and hygiene/grooming  PARTICIPATION LIMITATIONS: meal prep, cleaning, laundry, medication management, driving, shopping, community activity, occupation, and yard work  PERSONAL FACTORS: Age, Education, Financial risk analyst, Past/current experiences, Profession, Time since onset of injury/illness/exacerbation, Transportation, and 3+ comorbidities: Current low back pain are also affecting patient's functional outcome.   REHAB POTENTIAL: Good  CLINICAL DECISION MAKING: Evolving/moderate complexity  EVALUATION COMPLEXITY: Moderate   GOALS: Goals reviewed with patient? Yes  SHORT TERM GOALS: Target date: 08/31/24  Pt will be efficient with advanced HEP so she is more independent with daily tasks.  Baseline:  Goal status: INITIAL  2.   Pt will perform grip strength using dynamometer for baseline info and will create LTG based on baseline.  Baseline:  Goal status: INITIAL  3.  Pt will decrease NDI score at least 10 points so she  increases functional capacity for household chores. Baseline: 19/50 Goal status: INITIAL    LONG TERM GOALS: Target date: 09/12/24   Pt will be efficient with advanced HEP so she is more independent with daily tasks.  Baseline:  Goal status: INITIAL  2.  Pt will demonstrate increased cervical ROM with R SB and extension without symptoms by at least 10 degrees so she is able to read without adverse reactions.  Baseline: Extension: 15 deg, R SB: 35 deg Goal status: INITIAL  3.  Pt will increase mid/low trap strength, demonstrating at least 4/5 MMT during so she is able to improve posture for working at her desk.  Baseline: 3+/5 for mid/low trap bil Goal status: INITIAL  4.  Pt will report less than 2/10 for numbness in right shoulder so she is able to concentrate during driving.  Baseline: 4/10 Goal status: INITIAL  5.  Pt will report interrupted sleep of less than one hour due to right shoulder numbness so she is able to sleep through the night.  Baseline: 2-3 hours interrupted sleep  Goal status: INITIAL 6. TBD waiting for baseline of pt's grip strength testing  Baseline:  Goal status: INITIAL     PLAN:  PT FREQUENCY: 2x/week  PT DURATION: 8 weeks  PLANNED INTERVENTIONS: 97164- PT Re-evaluation, 97110-Therapeutic exercises, 97530- Therapeutic activity, 97112- Neuromuscular re-education, 97535- Self Care, [id number]- Manual therapy, 97035- Ultrasound, 02987- Traction (mechanical), [phone] (1-2 muscles), [id number] (3+ muscles)- Dry Needling, Patient/Family education, Joint mobilization, Spinal mobilization, Cryotherapy, and Moist heat  PLAN FOR NEXT SESSION: Nerve glides, mid/low trap strengthening, manual therapy to neck, SNAGs for increased cervical mobility       Lavanda Cleverly, SPT 08/01/24 3:55 PM Lavanda Cleverly, SPT 08/01/24 3:55 PM

## 2024-08-06 ENCOUNTER — Ambulatory Visit: Admitting: Physical Therapy

## 2024-08-06 ENCOUNTER — Encounter: Payer: Self-pay | Admitting: Physical Therapy

## 2024-08-06 DIAGNOSIS — M6281 Muscle weakness (generalized): Secondary | ICD-10-CM

## 2024-08-06 DIAGNOSIS — M5412 Radiculopathy, cervical region: Secondary | ICD-10-CM

## 2024-08-06 DIAGNOSIS — R293 Abnormal posture: Secondary | ICD-10-CM

## 2024-08-06 NOTE — Therapy (Signed)
 OUTPATIENT PHYSICAL THERAPY TREATMENT   Patient Name: Judith [name] MRN: [id number] DOB:[birth year], 58 y.o., female Today's Date: 08/06/2024  END OF SESSION:  PT End of Session - 08/06/24 0716     Visit Number 2    Number of Visits 17    Date for Recertification  09/12/24    Authorization Type [employer]    PT Start Time [phone]    PT Stop Time 0757    PT Time Calculation (min) 41 min           Past Medical History:  Diagnosis Date   Anxiety    Colon polyps    Hypercholesteremia    Past Surgical History:  Procedure Laterality Date   LAPAROSCOPY     Patient Active Problem List   Diagnosis Date Noted   DDD (degenerative disc disease), cervical 06/26/2024   OSA (obstructive sleep apnea) 07/05/2022   Bilateral dry eyes 12/09/2021   Cervical radiculopathy 08/27/2021   Primary insomnia 11/12/2020   Chronic pain of right knee 11/12/2020   Left lateral epicondylitis 12/28/2019   Chronic low back pain 11/16/2019   Leukocytosis 10/30/2019   Elevated hemoglobin 10/30/2019   Tobacco abuse 06/13/2017   Gastroesophageal reflux disease without esophagitis 05/30/2017   Mixed hyperlipidemia 05/30/2017    PCP: Zada Palin, NP  REFERRING PROVIDER: Waddell Lager, MD  REFERRING DIAG:    M54.12 (ICD-10-CM) - Radiculopathy, cervical region     THERAPY DIAG:  Radiculopathy, cervical region  Muscle weakness (generalized)  Abnormal posture   Rationale for Evaluation and Treatment: Rehabilitation  ONSET DATE: Chronic with intermittent flareups, recent flareup 3 months ago  SUBJECTIVE:                                                                                                                                                                                                        Per eval: Pt reports from her right shoulder to her fingers it goes numb. Pt denies pain, however rates numbness as having 4/10. Pt states numbess is worse at night, esp into middle  finger. She states she is also having low back pain that she plans to be treated later,however her neck/right arm  numbness is her priority.  Hand dominance: Right  SUBJECTIVE STATEMENT: 08/06/2024: feeling okay, some workout feeling w/ HEP. No numbness or pain at present. No other new updates  PERTINENT HISTORY:  Leukocytosis Chronic right knee pain Chronic LBP Left lateral epicondylitis Cervical radiculopathy DDD Smoker Allergic to gabapentin    PAIN:  Are you having pain? 08/06/2024: no pain/numbness.  Per eval: Yes: NPRS scale: 4/10 currently; 6/10 at [facility] at night  Pain location: Superior right shoulder down into middle finger  Pain description: like I pulled a muscle feeling , tingling feeling esp into middle finger  Aggravating factors: shoulder abducted position, neck extension  Relieving factors: NSAIDS, TENS unit; tried PT and epidural steroid injection years ago but not recently  PRECAUTIONS: None  RED FLAGS: None     WEIGHT BEARING RESTRICTIONS: No  FALLS:  Has patient fallen in last 6 months? 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  DIAGNOSTIC FINDINGS:  IMPRESSION: 1. Multilevel cervical spondylosis with resultant mild to moderate spinal stenosis at C4-5 through C6-7. 2. Multifactorial degenerative changes with resultant multilevel foraminal narrowing as above. Notable findings include moderate right C5 foraminal stenosis, severe left worse than right C6 foraminal narrowing, with severe right and moderate left C7 foraminal  stenosis. 3. Overall, these changes are mildly progressed as compared to prior MRI from 10/11/2021.      PATIENT SURVEYS:  NDI:  NECK DISABILITY INDEX  Date: 08/01/24 Score  Pain intensity 2 = The pain is moderate at the moment  2. Personal care (washing, dressing, etc.) 1 =  I can look after myself normally but it causes extra pain  3. Lifting 2 = Pain prevents me lifting heavy weights off the floor, but I can manage if they are  conveniently placed, for example on a table  4. Reading 3 = I can't read as much as I want because of moderate pain in my neck  5. Headaches 0 = I have no headaches at all  6. Concentration 2 = I have a fair degree of difficulty in concentrating when I want to  7. Work 2 = I can do most of my usual work, but no more  8. Driving 1 =  I can drive my car as long as I want with slight pain in my neck  9. Sleeping 3 =  My sleep is moderately disturbed (2-3 hrs sleepless)  10. Recreation 3 = I am able to engage in a few of my usual recreation activities because of pain in   my neck  Total 19/50   Minimum Detectable Change (90% confidence): 5 points or 10% points  COGNITION: Overall cognitive status: Within functional limits for tasks assessed  SENSATION: WFL  POSTURE: rounded shoulders and forward head  PALPATION: No tenderness to bil upper traps or perscap muscles    CERVICAL ROM:   Active ROM A/PROM (deg) eval  Flexion 40  Extension 15 had numbness  Right lateral flexion 35  Left lateral flexion 47  Right rotation 50  Left rotation 64   (Blank rows = not tested)  UPPER EXTREMITY ROM:  Active ROM Right eval Left eval  Shoulder flexion 135 130  Shoulder extension    Shoulder abduction 145 155  Shoulder adduction    Shoulder extension    Shoulder internal rotation 28 70  Shoulder external rotation 38 had numbness 40  Elbow flexion    Elbow extension    Wrist flexion  Wrist extension    Wrist ulnar deviation    Wrist radial deviation     Wrist pronation    Wrist supination     (Blank rows = not tested)  UPPER EXTREMITY MMT:  MMT Right eval Left eval  Shoulder flexion 4- 4  Shoulder extension 4- 4  Shoulder abduction 4- 4  Shoulder adduction    Shoulder extension    Shoulder internal rotation 4 4  Shoulder external rotation 4 4  Middle trapezius 3+ 4-  Lower trapezius 3+ 4-  Elbow flexion 4- 4  Elbow extension 4- 4  Wrist flexion 4- 4  Wrist extension 4- 4  Wrist ulnar deviation 4- 4  Wrist radial deviation 4- 4  Wrist pronation    Wrist supination    Grip strength     (Blank rows = not tested)    TREATMENT DATE:    [facility] Adult PT Treatment:                                                DATE: 08/06/24 Therapeutic Exercise: Supine chin tuck 2x12 cues for reduced compensations Supine shoulder flexion BIL 2x10 cues for full comfortable ROM  Seated cervicothoracic extension 2x10  HEP discussion/education  Neuromuscular re-ed: Seated RB horizontal abd 2x10 cues for reduced UT compensations, motor control Seated double ER + scap retraction 2x10 cues for posture and comfortable ROM Radial and median nerve testing ; negative median, equivocal positive radial nerve RUE Radial nerve glides RUE, modified x10    [facility] Adult PT Treatment:                                                DATE: 08/01/24 See HEP   Therapeutic Exercise:  Shoulder AROM retraction x15 Cervical AROM retraction x10 Shoulder abduction with red band x15                                                                                                                                  PATIENT EDUCATION:  Education details: rationale for interventions, HEP  Person educated: Patient Education method: Explanation, Demonstration, Tactile cues, Verbal cues Education comprehension: verbalized understanding, returned demonstration, verbal cues required, tactile cues required, and needs further education     HOME EXERCISE  PROGRAM: Access Code: [email or web address] URL: [email or web address]  Date: 08/01/2024 Prepared by: Lucie Meeter Exercises - Seated Cervical Retraction  - 3 x daily - 7 x weekly - 2 sets - 10 reps - Supine Shoulder Horizontal Abduction with Resistance  - 1 x daily - 7 x weekly - 2 sets - 10 reps   ASSESSMENT:  CLINICAL IMPRESSION: 08/06/2024: Pt arrives w/o resting pain/numbness, no changes  since initial eval. Does require min modifications with HEP to improve performance and reduce compensations. Mild transient increase in R fingertips with chin tucks but improves w/ repetition/cueing. Overall tolerates session well with cues as above, no adverse events. Equivocally positive radial nerve provocation on RUE, no symptoms w/ retest after modified radial nerve glides. Reports no symptoms on departure. Demonstrates general GH/cervicothoracic stiffness throughout. Recommend continuing along current POC in order to address relevant deficits and improve functional tolerance. Pt departs today's session in no acute distress, all voiced questions/concerns addressed appropriately from PT perspective.    Per eval: Patient is a 58 y.o. female who was seen today for physical therapy evaluation and treatment for cervical neck pain. Pt reports she's had ongoing chronic neck pain for several years with intermittent flare ups, her most recent flare up was 3 months ago. Reports right shoulder into middle finger numbness and tingling down C5-C7 nerve root distribution. She has numbness the most during active right shoulder ER, cervical extension, and right SB. She has moderate difficulty reading, sleeping, and doing recreational activities, and has mild difficulty lifting, concentrating, and working. She has done PT before and it worked, however she admits she's not doing specific exercises. She presents with decreased cervical ROM, decreased periscapular muscle strength, and postural malalignment. Pt will benefit from  skilled therapy to address the deficits below and increase functional capacity.   OBJECTIVE IMPAIRMENTS: decreased activity tolerance, decreased coordination, decreased endurance, decreased mobility, decreased ROM, decreased strength, increased fascial restrictions, increased muscle spasms, impaired flexibility, impaired tone, impaired UE functional use, improper body mechanics, and postural dysfunction.   ACTIVITY LIMITATIONS: carrying, lifting, bending, sleeping, transfers, bed mobility, bathing, toileting, dressing, reach over head, and hygiene/grooming  PARTICIPATION LIMITATIONS: meal prep, cleaning, laundry, medication management, driving, shopping, community activity, occupation, and yard work  PERSONAL FACTORS: Age, Education, Financial risk analyst, Past/current experiences, Profession, Time since onset of injury/illness/exacerbation, Transportation, and 3+ comorbidities: Current low back pain are also affecting patient's functional outcome.   REHAB POTENTIAL: Good  CLINICAL DECISION MAKING: Evolving/moderate complexity  EVALUATION COMPLEXITY: Moderate   GOALS: Goals reviewed with patient? Yes  SHORT TERM GOALS: Target date: 08/31/24  Pt will be efficient with advanced HEP so she is more independent with daily tasks.  Baseline:  Goal status: INITIAL  2.   Pt will perform grip strength using dynamometer for baseline info and will create LTG based on baseline.  Baseline:  Goal status: INITIAL  3.  Pt will decrease NDI score at least 10 points so she increases functional capacity for household chores. Baseline: 19/50 Goal status: INITIAL    LONG TERM GOALS: Target date: 09/12/24   Pt will be efficient with advanced HEP so she is more independent with daily tasks.  Baseline:  Goal status: INITIAL  2.  Pt will demonstrate increased cervical ROM with R SB and extension without symptoms by at least 10 degrees so she is able to read without adverse reactions.  Baseline: Extension: 15 deg,  R SB: 35 deg Goal status: INITIAL  3.  Pt will increase mid/low trap strength, demonstrating at least 4/5 MMT during so she is able to improve posture for working at her desk.  Baseline: 3+/5 for mid/low trap bil Goal status: INITIAL  4.  Pt will report less than 2/10 for numbness in right shoulder so she is able to concentrate during driving.  Baseline: 4/10 Goal status: INITIAL  5.  Pt will report interrupted sleep of less than one hour due to right shoulder numbness  so she is able to sleep through the night.  Baseline: 2-3 hours interrupted sleep  Goal status: INITIAL 6. TBD waiting for baseline of pt's grip strength testing  Baseline:  Goal status: INITIAL     PLAN:  PT FREQUENCY: 2x/week  PT DURATION: 8 weeks  PLANNED INTERVENTIONS: 97164- PT Re-evaluation, 97110-Therapeutic exercises, 97530- Therapeutic activity, 97112- Neuromuscular re-education, 97535- Self Care, [id number]- Manual therapy, 97035- Ultrasound, 02987- Traction (mechanical), [phone] (1-2 muscles), [id number] (3+ muscles)- Dry Needling, Patient/Family education, Joint mobilization, Spinal mobilization, Cryotherapy, and Moist heat  PLAN FOR NEXT SESSION: Nerve glides, mid/low trap strengthening, manual therapy to neck, SNAGs for increased cervical mobility     Alm DELENA Jenny PT, DPT 08/06/2024 8:01 AM

## 2024-08-08 ENCOUNTER — Ambulatory Visit

## 2024-08-08 NOTE — Therapy (Signed)
 OUTPATIENT PHYSICAL THERAPY TREATMENT   Patient Name: Judith [name] MRN: [id number] DOB:July 02, 1966, 58 y.o., female Today's Date: 08/09/2024  END OF SESSION:  PT End of Session - 08/09/24 0801     Visit Number 3    Number of Visits 17    Date for Recertification  09/12/24    Authorization Type [employer]    PT Start Time 0801    PT Stop Time 0840    PT Time Calculation (min) 39 min            Past Medical History:  Diagnosis Date   Anxiety    Colon polyps    Hypercholesteremia    Past Surgical History:  Procedure Laterality Date   LAPAROSCOPY     Patient Active Problem List   Diagnosis Date Noted   DDD (degenerative disc disease), cervical 06/26/2024   OSA (obstructive sleep apnea) 07/05/2022   Bilateral dry eyes 12/09/2021   Cervical radiculopathy 08/27/2021   Primary insomnia 11/12/2020   Chronic pain of right knee 11/12/2020   Left lateral epicondylitis 12/28/2019   Chronic low back pain 11/16/2019   Leukocytosis 10/30/2019   Elevated hemoglobin 10/30/2019   Tobacco abuse 06/13/2017   Gastroesophageal reflux disease without esophagitis 05/30/2017   Mixed hyperlipidemia 05/30/2017    PCP: Zada Palin, NP  REFERRING PROVIDER: Waddell Lager, MD  REFERRING DIAG:    M54.12 (ICD-10-CM) - Radiculopathy, cervical region     THERAPY DIAG:  Radiculopathy, cervical region  Muscle weakness (generalized)  Abnormal posture   Rationale for Evaluation and Treatment: Rehabilitation  ONSET DATE: Chronic with intermittent flareups, recent flareup 3 months ago  SUBJECTIVE:                                                                                                                                                                                                        Per eval: Pt reports from her right shoulder to her fingers it goes numb. Pt denies pain, however rates numbness as having 4/10. Pt states numbess is worse at night, esp into middle  finger. She states she is also having low back pain that she plans to be treated later,however her neck/right arm  numbness is her priority.  Hand dominance: Right  SUBJECTIVE STATEMENT: 08/09/2024: states she has been doing HEP. Has noticed L side bending and extension will consistently provoke symptoms. Feels like numbness is less frequent compared to when she first started PT. No issues after last session, no other new updates.    PERTINENT HISTORY:  Leukocytosis Chronic right knee pain Chronic  LBP Left lateral epicondylitis Cervical radiculopathy DDD Smoker Allergic to gabapentin    PAIN:  Are you having pain? 08/09/2024: no pain/numbness at rest. Will have 4-5/10 numbness RUE with sidebending to L   Per eval: Yes: NPRS scale: 4/10 currently; 6/10 at [facility] at night  Pain location: Superior right shoulder down into middle finger  Pain description: like I pulled a muscle feeling , tingling feeling esp into middle finger  Aggravating factors: shoulder abducted position, neck extension  Relieving factors: NSAIDS, TENS unit; tried PT and epidural steroid injection years ago but not recently  PRECAUTIONS: None  RED FLAGS: None     WEIGHT BEARING RESTRICTIONS: No  FALLS:  Has patient fallen in last 6 months? 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  DIAGNOSTIC FINDINGS:  IMPRESSION: 1. Multilevel cervical spondylosis with resultant mild to moderate spinal stenosis at C4-5 through C6-7. 2. Multifactorial degenerative changes with resultant  multilevel foraminal narrowing as above. Notable findings include moderate right C5 foraminal stenosis, severe left worse than right C6 foraminal narrowing, with severe right and moderate left C7 foraminal stenosis. 3. Overall, these changes are mildly progressed as compared to prior MRI from 10/11/2021.      PATIENT SURVEYS:  NDI:  NECK DISABILITY INDEX  Date: 08/01/24 Score  Pain intensity 2 = The pain is moderate at the moment  2. Personal care (washing, dressing, etc.) 1 =  I can look after myself normally but it causes extra pain  3. Lifting 2 = Pain prevents me lifting heavy weights off the floor, but I can manage if they are  conveniently placed, for example on a table  4. Reading 3 = I can't read as much as I want because of moderate pain in my neck  5. Headaches 0 = I have no headaches at all  6. Concentration 2 = I have a fair degree of difficulty in concentrating when I want to  7. Work 2 = I can do most of my usual work, but no more  8. Driving 1 =  I can drive my car as long as I want with slight pain in my neck  9. Sleeping 3 =  My sleep is moderately disturbed (2-3 hrs sleepless)  10. Recreation 3 = I am able to engage in a few of my usual recreation activities because of pain in   my neck  Total 19/50   Minimum Detectable Change (90% confidence): 5 points or 10% points  COGNITION: Overall cognitive status: Within functional limits for tasks assessed  SENSATION: WFL  POSTURE: rounded shoulders and forward head  PALPATION: No tenderness to bil upper traps or perscap muscles    CERVICAL ROM:   Active ROM A/PROM (deg) eval  Flexion 40  Extension 15 had numbness  Right lateral flexion 35  Left lateral flexion 47  Right rotation 50  Left rotation 64   (Blank rows = not tested)  UPPER EXTREMITY ROM:  Active ROM Right eval Left eval  Shoulder flexion 135 130  Shoulder extension    Shoulder abduction 145 155  Shoulder adduction  Shoulder extension     Shoulder internal rotation 28 70  Shoulder external rotation 38 had numbness 40  Elbow flexion    Elbow extension    Wrist flexion    Wrist extension    Wrist ulnar deviation    Wrist radial deviation    Wrist pronation    Wrist supination     (Blank rows = not tested)  UPPER EXTREMITY MMT:  MMT Right eval Left eval  Shoulder flexion 4- 4  Shoulder extension 4- 4  Shoulder abduction 4- 4  Shoulder adduction    Shoulder extension    Shoulder internal rotation 4 4  Shoulder external rotation 4 4  Middle trapezius 3+ 4-  Lower trapezius 3+ 4-  Elbow flexion 4- 4  Elbow extension 4- 4  Wrist flexion 4- 4  Wrist extension 4- 4  Wrist ulnar deviation 4- 4  Wrist radial deviation 4- 4  Wrist pronation    Wrist supination    Grip strength     (Blank rows = not tested)    TREATMENT DATE:   [facility] Adult PT Treatment:                                                DATE: 08/09/24 Therapeutic Exercise: Supine chin tuck 2x12 cues for reduced compensations Supine BIL shoulder flexion 2x12 cues for breath control HEP update + education/handout   Neuromuscular re-ed: YB diagonals, 2x8 BIL cues for sequencing and motor control  Radial nerve glides 2x10 frequent tactile cues for sequencing, weaning with repetition ( increased time to maximize self efficacy) Cervical sidebending isometrics to L x8 cues for appropriate force output and alignment  Cervical extension isometric x8 cues for appropriate force output  Self Care: Education throughout on relevant anatomy/physiology, rationale for interventions, monitoring symptoms and modifications PRN    [facility] Adult PT Treatment:                                                DATE: 08/06/24 Therapeutic Exercise: Supine chin tuck 2x12 cues for reduced compensations Supine shoulder flexion BIL 2x10 cues for full comfortable ROM  Seated cervicothoracic extension 2x10  HEP discussion/education  Neuromuscular re-ed: Seated RB  horizontal abd 2x10 cues for reduced UT compensations, motor control Seated double ER + scap retraction 2x10 cues for posture and comfortable ROM Radial and median nerve testing ; negative median, equivocal positive radial nerve RUE Radial nerve glides RUE, modified x10    [facility] Adult PT Treatment:                                                DATE: 08/01/24 See HEP   Therapeutic Exercise:  Shoulder AROM retraction x15 Cervical AROM retraction x10 Shoulder abduction with red band x15  PATIENT EDUCATION:  Education details: rationale for interventions, HEP  Person educated: Patient Education method: Explanation, Demonstration, Tactile cues, Verbal cues Education comprehension: verbalized understanding, returned demonstration, verbal cues required, tactile cues required, and needs further education     HOME EXERCISE PROGRAM: Access Code: [email or web address] URL: [email or web address] Date: 08/09/2024 Prepared by: Alm Jenny  Exercises - Supine Shoulder Horizontal Abduction with Resistance  - 2-3 x daily - 1 sets - 10 reps - Standing Shoulder Diagonal Horizontal Abduction 60/120 Degrees with Resistance  - 2-3 x daily - 1 sets - 6-8 reps - Supine Cervical Retraction with Towel  - 2-3 x daily - 1 sets - 8-10 reps - Supine Shoulder Flexion Extension Full Range AROM  - 2-3 x daily - 1 sets - 10 reps  ASSESSMENT:  CLINICAL IMPRESSION: 08/09/2024: Pt arrives w/o resting numbness, notes improvement in frequency compared to start of care and no issues after last session. Notes consistent provocation w/ extension and L sidebending; today we integrate cervical isometrics to address these movement sensitivities, good response. Also continue working on radial nerve glides which significantly reduces symptoms w/ L sidebending test+retest (10-15 deg improvement before  symptoms). Tolerates session well, no adverse events, symptoms continue with concordant movements as session goes on but reduced severity, no increase in resting symptoms. Recommend continuing along current POC in order to address relevant deficits and improve functional tolerance. Pt departs today's session in no acute distress, all voiced questions/concerns addressed appropriately from PT perspective.     Per eval: Patient is a 58 y.o. female who was seen today for physical therapy evaluation and treatment for cervical neck pain. Pt reports she's had ongoing chronic neck pain for several years with intermittent flare ups, her most recent flare up was 3 months ago. Reports right shoulder into middle finger numbness and tingling down C5-C7 nerve root distribution. She has numbness the most during active right shoulder ER, cervical extension, and right SB. She has moderate difficulty reading, sleeping, and doing recreational activities, and has mild difficulty lifting, concentrating, and working. She has done PT before and it worked, however she admits she's not doing specific exercises. She presents with decreased cervical ROM, decreased periscapular muscle strength, and postural malalignment. Pt will benefit from skilled therapy to address the deficits below and increase functional capacity.   OBJECTIVE IMPAIRMENTS: decreased activity tolerance, decreased coordination, decreased endurance, decreased mobility, decreased ROM, decreased strength, increased fascial restrictions, increased muscle spasms, impaired flexibility, impaired tone, impaired UE functional use, improper body mechanics, and postural dysfunction.   ACTIVITY LIMITATIONS: carrying, lifting, bending, sleeping, transfers, bed mobility, bathing, toileting, dressing, reach over head, and hygiene/grooming  PARTICIPATION LIMITATIONS: meal prep, cleaning, laundry, medication management, driving, shopping, community activity, occupation, and yard  work  PERSONAL FACTORS: Age, Education, Financial risk analyst, Past/current experiences, Profession, Time since onset of injury/illness/exacerbation, Transportation, and 3+ comorbidities: Current low back pain are also affecting patient's functional outcome.   REHAB POTENTIAL: Good  CLINICAL DECISION MAKING: Evolving/moderate complexity  EVALUATION COMPLEXITY: Moderate   GOALS: Goals reviewed with patient? Yes  SHORT TERM GOALS: Target date: 08/31/24  Pt will be efficient with advanced HEP so she is more independent with daily tasks.  Baseline:  Goal status: INITIAL  2.   Pt will perform grip strength using dynamometer for baseline info and will create LTG based on baseline.  Baseline:  Goal status: INITIAL  3.  Pt will decrease NDI score at least 10 points so she increases functional capacity for household chores. Baseline: 19/50 Goal status: INITIAL  LONG TERM GOALS: Target date: 09/12/24   Pt will be efficient with advanced HEP so she is more independent with daily tasks.  Baseline:  Goal status: INITIAL  2.  Pt will demonstrate increased cervical ROM with R SB and extension without symptoms by at least 10 degrees so she is able to read without adverse reactions.  Baseline: Extension: 15 deg, R SB: 35 deg Goal status: INITIAL  3.  Pt will increase mid/low trap strength, demonstrating at least 4/5 MMT during so she is able to improve posture for working at her desk.  Baseline: 3+/5 for mid/low trap bil Goal status: INITIAL  4.  Pt will report less than 2/10 for numbness in right shoulder so she is able to concentrate during driving.  Baseline: 4/10 Goal status: INITIAL  5.  Pt will report interrupted sleep of less than one hour due to right shoulder numbness so she is able to sleep through the night.  Baseline: 2-3 hours interrupted sleep  Goal status: INITIAL 6. TBD waiting for baseline of pt's grip strength testing  Baseline:  Goal status: INITIAL     PLAN:  PT  FREQUENCY: 2x/week  PT DURATION: 8 weeks  PLANNED INTERVENTIONS: 97164- PT Re-evaluation, 97110-Therapeutic exercises, 97530- Therapeutic activity, 97112- Neuromuscular re-education, 97535- Self Care, [id number]- Manual therapy, 97035- Ultrasound, [id number]- Traction (mechanical), [phone] (1-2 muscles), [id number] (3+ muscles)- Dry Needling, Patient/Family education, Joint mobilization, Spinal mobilization, Cryotherapy, and Moist heat  PLAN FOR NEXT SESSION: Nerve glides, mid/low trap strengthening, manual therapy to neck, SNAGs for increased cervical mobility     Alm DELENA Jenny PT, DPT 08/09/2024 8:45 AM

## 2024-08-09 ENCOUNTER — Ambulatory Visit: Payer: Self-pay | Admitting: Physical Therapy

## 2024-08-09 ENCOUNTER — Encounter: Payer: Self-pay | Admitting: Physical Therapy

## 2024-08-09 DIAGNOSIS — M5412 Radiculopathy, cervical region: Secondary | ICD-10-CM

## 2024-08-09 DIAGNOSIS — M6281 Muscle weakness (generalized): Secondary | ICD-10-CM

## 2024-08-09 DIAGNOSIS — R293 Abnormal posture: Secondary | ICD-10-CM

## 2024-08-10 NOTE — Therapy (Signed)
 OUTPATIENT PHYSICAL THERAPY TREATMENT   Patient Name: Judith [name] MRN: [id number] DOB:1966-02-04, 58 y.o., female Today's Date: 08/13/2024  END OF SESSION:  PT End of Session - 08/13/24 1101     Visit Number 4    Number of Visits 17    Date for Recertification  09/12/24    Authorization Type [employer]    PT Start Time 1101    PT Stop Time 1139    PT Time Calculation (min) 38 min             Past Medical History:  Diagnosis Date   Anxiety    Colon polyps    Hypercholesteremia    Past Surgical History:  Procedure Laterality Date   LAPAROSCOPY     Patient Active Problem List   Diagnosis Date Noted   DDD (degenerative disc disease), cervical 06/26/2024   OSA (obstructive sleep apnea) 07/05/2022   Bilateral dry eyes 12/09/2021   Cervical radiculopathy 08/27/2021   Primary insomnia 11/12/2020   Chronic pain of right knee 11/12/2020   Left lateral epicondylitis 12/28/2019   Chronic low back pain 11/16/2019   Leukocytosis 10/30/2019   Elevated hemoglobin 10/30/2019   Tobacco abuse 06/13/2017   Gastroesophageal reflux disease without esophagitis 05/30/2017   Mixed hyperlipidemia 05/30/2017    PCP: Zada Palin, NP  REFERRING PROVIDER: Waddell Lager, MD  REFERRING DIAG:    M54.12 (ICD-10-CM) - Radiculopathy, cervical region     THERAPY DIAG:  Radiculopathy, cervical region  Muscle weakness (generalized)  Abnormal posture   Rationale for Evaluation and Treatment: Rehabilitation  ONSET DATE: Chronic with intermittent flareups, recent flareup 3 months ago  SUBJECTIVE:                                                                                                                                                                                                        Per eval: Pt reports from her right shoulder to her fingers it goes numb. Pt denies pain, however rates numbness as having 4/10. Pt states numbess is worse at night, esp into middle  finger. She states she is also having low back pain that she plans to be treated later,however her neck/right arm  numbness is her priority.  Hand dominance: Right  SUBJECTIVE STATEMENT: 08/13/2024: states she just got back from travelling, feeling more stiff/tight. Also describes a bit more numbness which she attributes to travel.  Felt good after last session, has also been doing HEP with some relief.     PERTINENT HISTORY:  Leukocytosis Chronic right knee pain Chronic LBP Left  lateral epicondylitis Cervical radiculopathy DDD Smoker Allergic to gabapentin    PAIN:  Are you having pain? 08/13/2024: no pain/numbness at rest. Will have 2-3/10 numbness RUE with sidebending to L   Per eval: Yes: NPRS scale: 4/10 currently; 6/10 at [facility] at night  Pain location: Superior right shoulder down into middle finger  Pain description: like I pulled a muscle feeling , tingling feeling esp into middle finger  Aggravating factors: shoulder abducted position, neck extension  Relieving factors: NSAIDS, TENS unit; tried PT and epidural steroid injection years ago but not recently  PRECAUTIONS: None  RED FLAGS: None     WEIGHT BEARING RESTRICTIONS: No  FALLS:  Has patient fallen in last 6 months? 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  DIAGNOSTIC FINDINGS:  IMPRESSION: 1. Multilevel cervical spondylosis with resultant mild to moderate spinal stenosis at C4-5 through C6-7. 2. Multifactorial degenerative changes with resultant multilevel foraminal narrowing as above.  Notable findings include moderate right C5 foraminal stenosis, severe left worse than right C6 foraminal narrowing, with severe right and moderate left C7 foraminal stenosis. 3. Overall, these changes are mildly progressed as compared to prior MRI from 10/11/2021.      PATIENT SURVEYS:  NDI:  NECK DISABILITY INDEX  Date: 08/01/24 Score  Pain intensity 2 = The pain is moderate at the moment  2. Personal care (washing, dressing, etc.) 1 =  I can look after myself normally but it causes extra pain  3. Lifting 2 = Pain prevents me lifting heavy weights off the floor, but I can manage if they are  conveniently placed, for example on a table  4. Reading 3 = I can't read as much as I want because of moderate pain in my neck  5. Headaches 0 = I have no headaches at all  6. Concentration 2 = I have a fair degree of difficulty in concentrating when I want to  7. Work 2 = I can do most of my usual work, but no more  8. Driving 1 =  I can drive my car as long as I want with slight pain in my neck  9. Sleeping 3 =  My sleep is moderately disturbed (2-3 hrs sleepless)  10. Recreation 3 = I am able to engage in a few of my usual recreation activities because of pain in   my neck  Total 19/50   Minimum Detectable Change (90% confidence): 5 points or 10% points  COGNITION: Overall cognitive status: Within functional limits for tasks assessed  SENSATION: WFL  POSTURE: rounded shoulders and forward head  PALPATION: No tenderness to bil upper traps or perscap muscles    CERVICAL ROM:   Active ROM A/PROM (deg) eval  Flexion 40  Extension 15 had numbness  Right lateral flexion 35  Left lateral flexion 47  Right rotation 50  Left rotation 64   (Blank rows = not tested)  UPPER EXTREMITY ROM:  Active ROM Right eval Left eval  Shoulder flexion 135 130  Shoulder extension    Shoulder abduction 145 155  Shoulder adduction  Shoulder extension    Shoulder internal rotation 28 70   Shoulder external rotation 38 had numbness 40  Elbow flexion    Elbow extension    Wrist flexion    Wrist extension    Wrist ulnar deviation    Wrist radial deviation    Wrist pronation    Wrist supination     (Blank rows = not tested)  UPPER EXTREMITY MMT:  MMT Right eval Left eval  Shoulder flexion 4- 4  Shoulder extension 4- 4  Shoulder abduction 4- 4  Shoulder adduction    Shoulder extension    Shoulder internal rotation 4 4  Shoulder external rotation 4 4  Middle trapezius 3+ 4-  Lower trapezius 3+ 4-  Elbow flexion 4- 4  Elbow extension 4- 4  Wrist flexion 4- 4  Wrist extension 4- 4  Wrist ulnar deviation 4- 4  Wrist radial deviation 4- 4  Wrist pronation    Wrist supination    Grip strength     (Blank rows = not tested)    TREATMENT DATE:   [facility] Adult PT Treatment:                                                DATE: 08/13/24 Therapeutic Exercise: Swiss ball flexion up wall x12  Standing open books x10 BIL; significant verbal and tactile cueing for reduced shoulder hike RUE, reduced IR compensations HEP update + education/handout  Neuromuscular re-ed: Cervical sidebending isometric x10 BIL cues for midline  Cervical extension isometric x10  GB row x10 cues for periscapular activation - significant time for tactile/verbal cues to mitigate shoulder hike and facilitate retraction GB shoulder ext x10 cues for lat activation and reduced elbow compensations Radial nerve glides modified 2x12 RUE cues for sequencing and appropriate ROM w/ regard to symptom behavior, inc time for education to allow for good management w/ HEP      [facility] Adult PT Treatment:                                                DATE: 08/09/24 Therapeutic Exercise: Supine chin tuck 2x12 cues for reduced compensations Supine BIL shoulder flexion 2x12 cues for breath control HEP update + education/handout   Neuromuscular re-ed: YB diagonals, 2x8 BIL cues for sequencing and motor  control  Radial nerve glides 2x10 frequent tactile cues for sequencing, weaning with repetition ( increased time to maximize self efficacy) Cervical sidebending isometrics to L x8 cues for appropriate force output and alignment  Cervical extension isometric x8 cues for appropriate force output  Self Care: Education throughout on relevant anatomy/physiology, rationale for interventions, monitoring symptoms and modifications PRN    [facility] Adult PT Treatment:                                                DATE: 08/06/24 Therapeutic Exercise: Supine chin tuck 2x12 cues for reduced compensations Supine shoulder flexion BIL 2x10 cues for full comfortable ROM  Seated cervicothoracic extension 2x10  HEP discussion/education  Neuromuscular re-ed: Seated RB horizontal abd 2x10 cues for reduced UT compensations, motor  control Seated double ER + scap retraction 2x10 cues for posture and comfortable ROM Radial and median nerve testing ; negative median, equivocal positive radial nerve RUE Radial nerve glides RUE, modified x10                                                                                                                              PATIENT EDUCATION:  Education details: rationale for interventions, HEP  Person educated: Patient Education method: Explanation, Demonstration, Tactile cues, Verbal cues Education comprehension: verbalized understanding, returned demonstration, verbal cues required, tactile cues required, and needs further education     HOME EXERCISE PROGRAM: Access Code: [email or web address] URL: [email or web address] Date: 08/13/2024 Prepared by: Alm Jenny  Exercises - Supine Shoulder Horizontal Abduction with Resistance  - 2-3 x daily - 1 sets - 10 reps - Standing Shoulder Diagonal Horizontal Abduction 60/120 Degrees with Resistance  - 2-3 x daily - 1 sets - 6-8 reps - Supine Cervical Retraction with Towel  - 2-3 x daily - 1 sets - 8-10 reps - Supine  Shoulder Flexion Extension Full Range AROM  - 2-3 x daily - 1 sets - 10 reps - Standing Radial Nerve Glide  - 2-3 x daily - 1 sets - 8 reps  ASSESSMENT:  CLINICAL IMPRESSION: 08/13/2024: Pt arrives w/ a bit more stiffness/numbness she attributes to travel, good response to last session. Today continuing to work on cervical stability with isometrics, progressions for periscapular activation work. Also working on radial nerve glides with continued positive response. Much of today's cueing revolving around improving scapular mechanics as on RUE she demonstrates increased shoulder hiking and minimal scapular retraction, improves w/ repetition/cues. No adverse events, reports no resting numbness and reduced stiffness on departure. Recommend continuing along current POC in order to address relevant deficits and improve functional tolerance. Pt departs today's session in no acute distress, all voiced questions/concerns addressed appropriately from PT perspective.      Per eval: Patient is a 58 y.o. female who was seen today for physical therapy evaluation and treatment for cervical neck pain. Pt reports she's had ongoing chronic neck pain for several years with intermittent flare ups, her most recent flare up was 3 months ago. Reports right shoulder into middle finger numbness and tingling down C5-C7 nerve root distribution. She has numbness the most during active right shoulder ER, cervical extension, and right SB. She has moderate difficulty reading, sleeping, and doing recreational activities, and has mild difficulty lifting, concentrating, and working. She has done PT before and it worked, however she admits she's not doing specific exercises. She presents with decreased cervical ROM, decreased periscapular muscle strength, and postural malalignment. Pt will benefit from skilled therapy to address the deficits below and increase functional capacity.   OBJECTIVE IMPAIRMENTS: decreased activity tolerance,  decreased coordination, decreased endurance, decreased mobility, decreased ROM, decreased strength, increased fascial restrictions, increased muscle spasms, impaired flexibility, impaired tone, impaired UE functional use, improper body mechanics, and postural dysfunction.  ACTIVITY LIMITATIONS: carrying, lifting, bending, sleeping, transfers, bed mobility, bathing, toileting, dressing, reach over head, and hygiene/grooming  PARTICIPATION LIMITATIONS: meal prep, cleaning, laundry, medication management, driving, shopping, community activity, occupation, and yard work  PERSONAL FACTORS: Age, Education, Financial risk analyst, Past/current experiences, Profession, Time since onset of injury/illness/exacerbation, Transportation, and 3+ comorbidities: Current low back pain are also affecting patient's functional outcome.   REHAB POTENTIAL: Good  CLINICAL DECISION MAKING: Evolving/moderate complexity  EVALUATION COMPLEXITY: Moderate   GOALS: Goals reviewed with patient? Yes  SHORT TERM GOALS: Target date: 08/31/24  Pt will be efficient with advanced HEP so she is more independent with daily tasks.  Baseline:  Goal status: INITIAL  2.   Pt will perform grip strength using dynamometer for baseline info and will create LTG based on baseline.  Baseline:  Goal status: INITIAL  3.  Pt will decrease NDI score at least 10 points so she increases functional capacity for household chores. Baseline: 19/50 Goal status: INITIAL    LONG TERM GOALS: Target date: 09/12/24   Pt will be efficient with advanced HEP so she is more independent with daily tasks.  Baseline:  Goal status: INITIAL  2.  Pt will demonstrate increased cervical ROM with R SB and extension without symptoms by at least 10 degrees so she is able to read without adverse reactions.  Baseline: Extension: 15 deg, R SB: 35 deg Goal status: INITIAL  3.  Pt will increase mid/low trap strength, demonstrating at least 4/5 MMT during so she is able to  improve posture for working at her desk.  Baseline: 3+/5 for mid/low trap bil Goal status: INITIAL  4.  Pt will report less than 2/10 for numbness in right shoulder so she is able to concentrate during driving.  Baseline: 4/10 Goal status: INITIAL  5.  Pt will report interrupted sleep of less than one hour due to right shoulder numbness so she is able to sleep through the night.  Baseline: 2-3 hours interrupted sleep  Goal status: INITIAL 6. TBD waiting for baseline of pt's grip strength testing  Baseline:  Goal status: INITIAL     PLAN:  PT FREQUENCY: 2x/week  PT DURATION: 8 weeks  PLANNED INTERVENTIONS: [id number]- PT Re-evaluation, 97110-Therapeutic exercises, 97530- Therapeutic activity, 97112- Neuromuscular re-education, 97535- Self Care, [id number]- Manual therapy, 97035- Ultrasound, [id number]- Traction (mechanical), [phone] (1-2 muscles), [id number] (3+ muscles)- Dry Needling, Patient/Family education, Joint mobilization, Spinal mobilization, Cryotherapy, and Moist heat  PLAN FOR NEXT SESSION: Nerve glides, mid/low trap strengthening, manual therapy to neck, SNAGs for increased cervical mobility     Alm DELENA Jenny PT, DPT 08/13/2024 11:43 AM

## 2024-08-13 ENCOUNTER — Ambulatory Visit: Admitting: Physical Therapy

## 2024-08-13 ENCOUNTER — Encounter: Payer: Self-pay | Admitting: Physical Therapy

## 2024-08-13 DIAGNOSIS — R293 Abnormal posture: Secondary | ICD-10-CM | POA: Diagnosis not present

## 2024-08-13 DIAGNOSIS — M6281 Muscle weakness (generalized): Secondary | ICD-10-CM | POA: Diagnosis not present

## 2024-08-13 DIAGNOSIS — M5412 Radiculopathy, cervical region: Secondary | ICD-10-CM | POA: Diagnosis not present

## 2024-08-16 ENCOUNTER — Ambulatory Visit

## 2024-08-16 DIAGNOSIS — M5412 Radiculopathy, cervical region: Secondary | ICD-10-CM | POA: Diagnosis not present

## 2024-08-16 DIAGNOSIS — R293 Abnormal posture: Secondary | ICD-10-CM | POA: Diagnosis not present

## 2024-08-16 DIAGNOSIS — M6281 Muscle weakness (generalized): Secondary | ICD-10-CM | POA: Diagnosis not present

## 2024-08-16 NOTE — Therapy (Addendum)
 OUTPATIENT PHYSICAL THERAPY TREATMENT   Patient Name: Judith [name] MRN: [id number] DOB:1966-08-31, 58 y.o., female Today's Date: 08/16/2024  END OF SESSION:  PT End of Session - 08/16/24 1617     Visit Number 5    Number of Visits 17    Date for Recertification  09/12/24    Authorization Type [employer]    PT Start Time 1615    PT Stop Time 1658    PT Time Calculation (min) 43 min    Activity Tolerance Patient tolerated treatment well    Behavior During Therapy WFL for tasks assessed/performed              Past Medical History:  Diagnosis Date   Anxiety    Colon polyps    Hypercholesteremia    Past Surgical History:  Procedure Laterality Date   LAPAROSCOPY     Patient Active Problem List   Diagnosis Date Noted   DDD (degenerative disc disease), cervical 06/26/2024   OSA (obstructive sleep apnea) 07/05/2022   Bilateral dry eyes 12/09/2021   Cervical radiculopathy 08/27/2021   Primary insomnia 11/12/2020   Chronic pain of right knee 11/12/2020   Left lateral epicondylitis 12/28/2019   Chronic low back pain 11/16/2019   Leukocytosis 10/30/2019   Elevated hemoglobin 10/30/2019   Tobacco abuse 06/13/2017   Gastroesophageal reflux disease without esophagitis 05/30/2017   Mixed hyperlipidemia 05/30/2017    PCP: Zada Palin, NP  REFERRING PROVIDER: Waddell Lager, MD  REFERRING DIAG:    M54.12 (ICD-10-CM) - Radiculopathy, cervical region     THERAPY DIAG:  Radiculopathy, cervical region  Muscle weakness (generalized)  Abnormal posture   Rationale for Evaluation and Treatment: Rehabilitation  ONSET DATE: Chronic with intermittent flareups, recent flareup 3 months ago  SUBJECTIVE:                                                                                                                                                                                                        Per eval: Pt reports from her right shoulder to her fingers it  goes numb. Pt denies pain, however rates numbness as having 4/10. Pt states numbess is worse at night, esp into middle finger. She states she is also having low back pain that she plans to be treated later,however her neck/right arm  numbness is her priority.  Hand dominance: Right  Pt states she is doing much better since starting PT, and has significantly  decreased numbness in her right arm since starting PT. She brought the print out of her HEP to session  today.  SUBJECTIVE STATEMENT: 08/16/2024: states she just got back from travelling, feeling more stiff/tight. Also describes a bit more numbness which she attributes to travel.  Felt good after last session, has also been doing HEP with some relief.     PERTINENT HISTORY:  Leukocytosis Chronic right knee pain Chronic LBP Left lateral epicondylitis Cervical radiculopathy DDD Smoker Allergic to gabapentin    PAIN:  Are you having pain? 08/16/2024: no pain/numbness at rest. Will have 1-2/10 numbness RUE with sidebending to L   Per eval: Yes: NPRS scale: 1/10 currently; 6/10 at [facility] at night  Pain location: Superior right shoulder down into middle finger  Pain description: like I pulled a muscle feeling , tingling feeling esp into middle finger  Aggravating factors: shoulder abducted position, neck extension  Relieving factors: NSAIDS, TENS unit; tried PT and epidural steroid injection years ago but not recently  PRECAUTIONS: None  RED FLAGS: None     WEIGHT BEARING RESTRICTIONS: No  FALLS:  Has patient fallen in last 6 months? 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  DIAGNOSTIC FINDINGS:  IMPRESSION: 1. Multilevel cervical spondylosis with resultant mild to moderate spinal stenosis at C4-5 through C6-7. 2. Multifactorial degenerative changes with resultant multilevel foraminal narrowing as above. Notable findings include moderate right C5 foraminal stenosis, severe left worse than right C6 foraminal narrowing, with severe right and moderate left C7 foraminal stenosis. 3. Overall, these changes are mildly progressed as compared to prior MRI from 10/11/2021.      PATIENT SURVEYS:  NDI:  NECK DISABILITY INDEX  Date: 08/01/24 Score  Pain intensity 2 = The pain is moderate at the moment  2. Personal care (washing, dressing, etc.) 1 =  I can look after myself normally but it causes extra pain  3. Lifting 2 = Pain prevents me lifting heavy weights off the floor, but I can manage if they are  conveniently placed, for example on a table  4. Reading 3 = I can't read as much as I want because of moderate pain in my neck  5. Headaches 0 = I have no headaches at all  6. Concentration 2 = I have a fair degree of difficulty in concentrating when I want to  7. Work 2 = I can do most of my usual work, but no more  8. Driving 1 =  I can drive my car as long as I want with slight pain in my neck  9. Sleeping 3 =  My sleep is moderately disturbed (2-3 hrs sleepless)  10. Recreation 3 = I am able to engage in a few of my usual recreation activities because of pain in   my neck  Total 19/50   Minimum Detectable Change (90% confidence): 5 points or 10% points  COGNITION: Overall cognitive status: Within functional limits for tasks assessed  SENSATION: WFL  POSTURE: rounded shoulders and forward head  PALPATION: No tenderness to bil upper traps or perscap muscles    CERVICAL ROM:   Active ROM A/PROM (deg) eval  Flexion 40  Extension 15 had numbness  Right lateral flexion 35  Left  lateral flexion 47  Right rotation 50  Left rotation 64   (Blank rows = not tested)  UPPER EXTREMITY ROM:  Active ROM Right eval Left eval  Shoulder flexion 135 130  Shoulder extension    Shoulder abduction 145 155  Shoulder adduction    Shoulder extension    Shoulder internal rotation 28 70  Shoulder external rotation 38 had numbness 40  Elbow flexion    Elbow extension    Wrist flexion    Wrist extension    Wrist ulnar deviation    Wrist radial deviation    Wrist pronation    Wrist supination     (Blank rows = not tested)  UPPER EXTREMITY MMT:  MMT Right eval Left eval  Shoulder flexion 4- 4  Shoulder extension 4- 4  Shoulder abduction 4- 4  Shoulder adduction    Shoulder extension    Shoulder internal rotation 4 4  Shoulder external rotation 4 4  Middle trapezius 3+ 4-  Lower trapezius 3+ 4-  Elbow flexion 4- 4  Elbow extension 4- 4  Wrist flexion 4- 4  Wrist extension 4- 4  Wrist ulnar deviation 4- 4  Wrist radial deviation 4- 4  Wrist pronation    Wrist supination    Grip strength     (Blank rows = not tested)    TREATMENT DATE:  [facility] Adult PT Treatment:                                                DATE: 08/16/24 Therapeutic Exercise: Ladder Y lift off x15 HEP review- demo and performed as needed   Neuromuscular re-ed: Rows in standing with green band 2x10 Subscapularis Lifts offs 2x10 each arm  Isometric walk outs ER and IR x10 each arm  AROM cervical retraction x20     [facility] Adult PT Treatment:                                                DATE: 08/13/24 Therapeutic Exercise: Swiss ball flexion up wall x12  Standing open books x10 BIL; significant verbal and tactile cueing for reduced shoulder hike RUE, reduced IR compensations HEP update + education/handout  Neuromuscular re-ed: Cervical sidebending isometric x10 BIL cues for midline  Cervical extension isometric x10  GB row x10 cues for periscapular activation - significant time  for tactile/verbal cues to mitigate shoulder hike and facilitate retraction GB shoulder ext x10 cues for lat activation and reduced elbow compensations Radial nerve glides modified 2x12 RUE cues for sequencing and appropriate ROM w/ regard to symptom behavior, inc time for education to allow for good management w/ HEP      [facility] Adult PT Treatment:                                                DATE: 08/09/24 Therapeutic Exercise: Supine chin tuck 2x12 cues for reduced compensations Supine BIL shoulder flexion 2x12 cues for breath control HEP update + education/handout   Neuromuscular re-ed: YB diagonals, 2x8 BIL cues for sequencing and motor  control  Radial nerve glides 2x10 frequent tactile cues for sequencing, weaning with repetition ( increased time to maximize self efficacy) Cervical sidebending isometrics to L x8 cues for appropriate force output and alignment  Cervical extension isometric x8 cues for appropriate force output  Self Care: Education throughout on relevant anatomy/physiology, rationale for interventions, monitoring symptoms and modifications PRN                                                                                                                                 PATIENT EDUCATION:  Education details: rationale for interventions, HEP  Person educated: Patient Education method: Explanation, Demonstration, Tactile cues, Verbal cues Education comprehension: verbalized understanding, returned demonstration, verbal cues required, tactile cues required, and needs further education     HOME EXERCISE PROGRAM: Access Code: [email or web address] URL: [email or web address] Date: 08/13/2024 Prepared by: Alm Jenny  Exercises - Supine Shoulder Horizontal Abduction with Resistance  - 2-3 x daily - 1 sets - 10 reps - Standing Shoulder Diagonal Horizontal Abduction 60/120 Degrees with Resistance  - 2-3 x daily - 1 sets - 6-8 reps - Supine Cervical Retraction  with Towel  - 2-3 x daily - 1 sets - 8-10 reps - Supine Shoulder Flexion Extension Full Range AROM  - 2-3 x daily - 1 sets - 10 reps - Standing Radial Nerve Glide  - 2-3 x daily - 1 sets - 8 reps  ASSESSMENT:  CLINICAL IMPRESSION: Pt presents to therapy with her HEP print out, went through each exercise thoroughly to monitor proper form technique and note any compensations. Today's focus on periscapular activating exercises and reviewing HEP. Required several verbal cues to complete horizontal Abduction correctly with arms out. Required several verbal and tactile cues for cervical retraction, progressed to performing in seated. Pt states significantly decreased numbness in arm since starting PT.     Per eval: Patient is a 58 y.o. female who was seen today for physical therapy evaluation and treatment for cervical neck pain. Pt reports she's had ongoing chronic neck pain for several years with intermittent flare ups, her most recent flare up was 3 months ago. Reports right shoulder into middle finger numbness and tingling down C5-C7 nerve root distribution. She has numbness the most during active right shoulder ER, cervical extension, and right SB. She has moderate difficulty reading, sleeping, and doing recreational activities, and has mild difficulty lifting, concentrating, and working. She has done PT before and it worked, however she admits she's not doing specific exercises. She presents with decreased cervical ROM, decreased periscapular muscle strength, and postural malalignment. Pt will benefit from skilled therapy to address the deficits below and increase functional capacity.   OBJECTIVE IMPAIRMENTS: decreased activity tolerance, decreased coordination, decreased endurance, decreased mobility, decreased ROM, decreased strength, increased fascial restrictions, increased muscle spasms, impaired flexibility, impaired tone, impaired UE functional use, improper body mechanics, and postural  dysfunction.   ACTIVITY LIMITATIONS: carrying, lifting, bending, sleeping, transfers, bed mobility,  bathing, toileting, dressing, reach over head, and hygiene/grooming  PARTICIPATION LIMITATIONS: meal prep, cleaning, laundry, medication management, driving, shopping, community activity, occupation, and yard work  PERSONAL FACTORS: Age, Education, Fitness, Past/current experiences, Profession, Time since onset of injury/illness/exacerbation, Transportation, and 3+ comorbidities: Current low back pain are also affecting patient's functional outcome.   REHAB POTENTIAL: Good  CLINICAL DECISION MAKING: Evolving/moderate complexity  EVALUATION COMPLEXITY: Moderate   GOALS: Goals reviewed with patient? Yes  SHORT TERM GOALS: Target date: 08/31/24  Pt will be efficient with advanced HEP so she is more independent with daily tasks.  Baseline:  Goal status: MET 08/16/24  2.   Pt will perform grip strength using dynamometer for baseline info and will create LTG based on baseline.  Baseline:  Goal status: INITIAL  3.  Pt will decrease NDI score at least 10 points so she increases functional capacity for household chores. Baseline: 19/50 Goal status: INITIAL    LONG TERM GOALS: Target date: 09/12/24   Pt will be efficient with advanced HEP so she is more independent with daily tasks.  Baseline:  Goal status: INITIAL  2.  Pt will demonstrate increased cervical ROM with R SB and extension without symptoms by at least 10 degrees so she is able to read without adverse reactions.  Baseline: Extension: 15 deg, R SB: 35 deg Goal status: INITIAL  3.  Pt will increase mid/low trap strength, demonstrating at least 4/5 MMT during so she is able to improve posture for working at her desk.  Baseline: 3+/5 for mid/low trap bil Goal status: INITIAL  4.  Pt will report less than 2/10 for numbness in right shoulder so she is able to concentrate during driving.  Baseline: 4/10 Goal status:  INITIAL  5.  Pt will report interrupted sleep of less than one hour due to right shoulder numbness so she is able to sleep through the night.  Baseline: 2-3 hours interrupted sleep  Goal status: INITIAL 6. TBD waiting for baseline of pt's grip strength testing  Baseline:  Goal status: INITIAL     PLAN:  PT FREQUENCY: 2x/week  PT DURATION: 8 weeks  PLANNED INTERVENTIONS: 97164- PT Re-evaluation, 97110-Therapeutic exercises, 97530- Therapeutic activity, 97112- Neuromuscular re-education, 97535- Self Care, 02859- Manual therapy, 97035- Ultrasound, 02987- Traction (mechanical), [phone] (1-2 muscles), 20561 (3+ muscles)- Dry Needling, Patient/Family education, Joint mobilization, Spinal mobilization, Cryotherapy, and Moist heat  PLAN FOR NEXT SESSION: Nerve glides, mid/low trap strengthening, manual therapy to neck   Lavanda Cleverly, SPT 08/16/24 5:09 PM  Lucie Meeter, PT, DPT, ATC 08/16/24 5:12 PM

## 2024-08-19 NOTE — Progress Notes (Deleted)
 Patient ID: Judith [name]                 DOB: April 24, 1966                    MRN: [id number]      HPI: Judith [name] is a 58 y.o. female patient referred to lipid clinic by ***. PMH is significant for    Reviewed options for lowering LDL cholesterol, including ezetimibe , PCSK-9 inhibitors, bempedoic acid and inclisiran.  Discussed mechanisms of action, dosing, side effects and potential decreases in LDL cholesterol.  Also reviewed cost information and potential options for patient assistance.  Current Medications:  Intolerances:  Risk Factors:  LDL goal:   Diet:   Exercise:   Family History:   Social History:   Labs:  Lipid Panel     Component Value Date/Time   CHOL 125 05/24/2024 1026   TRIG 125 05/24/2024 1026   HDL 43 05/24/2024 1026   CHOLHDL 2.9 05/24/2024 1026   CHOLHDL 3.4 01/07/2023 1357   LDLCALC 60 05/24/2024 1026   LDLCALC 73 01/07/2023 1357   LABVLDL 22 05/24/2024 1026    Past Medical History:  Diagnosis Date   Anxiety    Colon polyps    Hypercholesteremia     Current Outpatient Medications on File Prior to Visit  Medication Sig Dispense Refill   aspirin  EC 81 MG tablet Take 1 tablet (81 mg total) by mouth daily. Swallow whole.     atorvastatin  (LIPITOR) 40 MG tablet Take 1 tablet (40 mg total) by mouth daily. 90 tablet 3   ezetimibe  (ZETIA ) 10 MG tablet Take 1 tablet (10 mg total) by mouth daily. 90 tablet 3   pregabalin  (LYRICA ) 50 MG capsule Take 1 capsule (50 mg total) by mouth 2 (two) times daily. (Patient not taking: Reported on 08/01/2024) 60 capsule 2   No current facility-administered medications on file prior to visit.    Allergies  Allergen Reactions   Gabapentin  Other (See Comments)    Dizzy and overall drugged feeling    Assessment/Plan:  1. Hyperlipidemia -  No problems updated. No problem-specific Assessment & Plan notes found for this encounter.    Thank you,  Robbi Blanch, Pharm.D [facility] [address], [place], [place] [address] Phone: [phone]; Fax: [phone]

## 2024-08-20 ENCOUNTER — Ambulatory Visit: Admitting: Pharmacist

## 2024-08-22 ENCOUNTER — Ambulatory Visit

## 2024-08-24 NOTE — Therapy (Deleted)
 OUTPATIENT PHYSICAL THERAPY TREATMENT   Patient Name: Judith [name] MRN: [id number] DOB:[birth year], 58 y.o., female Today's Date: 08/24/2024  END OF SESSION:        Past Medical History:  Diagnosis Date   Anxiety    Colon polyps    Hypercholesteremia    Past Surgical History:  Procedure Laterality Date   LAPAROSCOPY     Patient Active Problem List   Diagnosis Date Noted   DDD (degenerative disc disease), cervical 06/26/2024   OSA (obstructive sleep apnea) 07/05/2022   Bilateral dry eyes 12/09/2021   Cervical radiculopathy 08/27/2021   Primary insomnia 11/12/2020   Chronic pain of right knee 11/12/2020   Left lateral epicondylitis 12/28/2019   Chronic low back pain 11/16/2019   Leukocytosis 10/30/2019   Elevated hemoglobin 10/30/2019   Tobacco abuse 06/13/2017   Gastroesophageal reflux disease without esophagitis 05/30/2017   Mixed hyperlipidemia 05/30/2017    PCP: Zada Palin, NP  REFERRING PROVIDER: Waddell Lager, MD  REFERRING DIAG:    M54.12 (ICD-10-CM) - Radiculopathy, cervical region     THERAPY DIAG:  No diagnosis found.   Rationale for Evaluation and Treatment: Rehabilitation  ONSET DATE: Chronic with intermittent flareups, recent flareup 3 months ago  SUBJECTIVE:                                                                                                                                                                                                        Per eval: Pt reports from her right shoulder to her fingers it goes numb. Pt denies pain, however rates numbness as having 4/10. Pt states numbess is worse at night, esp into middle finger. She states she is also having low back pain that she plans to be treated later,however her neck/right arm  numbness is her priority.  Hand dominance: Right  Pt states she is doing much better since starting PT, and has significantly  decreased numbness in her right arm since starting PT. She brought the  print out of her HEP to session today.  SUBJECTIVE STATEMENT: 08/24/2024: states she just got back from travelling, feeling more stiff/tight. Also describes a bit more numbness which she attributes to travel.  Felt good after last session, has also been doing HEP with some relief.     PERTINENT HISTORY:  Leukocytosis Chronic right knee pain Chronic LBP Left lateral epicondylitis Cervical radiculopathy DDD Smoker Allergic to gabapentin    PAIN:  Are you having pain? 08/24/2024: no pain/numbness at rest. Will have 1-2/10 numbness RUE with sidebending to L   Per eval: Yes: NPRS scale: 1/10  currently; 6/10 at [facility] at night  Pain location: Superior right shoulder down into middle finger  Pain description: like I pulled a muscle feeling , tingling feeling esp into middle finger  Aggravating factors: shoulder abducted position, neck extension  Relieving factors: NSAIDS, TENS unit; tried PT and epidural steroid injection years ago but not recently  PRECAUTIONS: None  RED FLAGS: None     WEIGHT BEARING RESTRICTIONS: No  FALLS:  Has patient fallen in last 6 months? 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  DIAGNOSTIC FINDINGS:  IMPRESSION: 1. Multilevel cervical spondylosis with resultant mild to moderate spinal stenosis at C4-5 through C6-7. 2. Multifactorial degenerative changes with resultant multilevel foraminal narrowing as above. Notable findings include moderate right C5 foraminal stenosis, severe left worse than right C6 foraminal narrowing, with  severe right and moderate left C7 foraminal stenosis. 3. Overall, these changes are mildly progressed as compared to prior MRI from 10/11/2021.      PATIENT SURVEYS:  NDI:  NECK DISABILITY INDEX  Date: 08/01/24 Score  Pain intensity 2 = The pain is moderate at the moment  2. Personal care (washing, dressing, etc.) 1 =  I can look after myself normally but it causes extra pain  3. Lifting 2 = Pain prevents me lifting heavy weights off the floor, but I can manage if they are  conveniently placed, for example on a table  4. Reading 3 = I can't read as much as I want because of moderate pain in my neck  5. Headaches 0 = I have no headaches at all  6. Concentration 2 = I have a fair degree of difficulty in concentrating when I want to  7. Work 2 = I can do most of my usual work, but no more  8. Driving 1 =  I can drive my car as long as I want with slight pain in my neck  9. Sleeping 3 =  My sleep is moderately disturbed (2-3 hrs sleepless)  10. Recreation 3 = I am able to engage in a few of my usual recreation activities because of pain in   my neck  Total 19/50   Minimum Detectable Change (90% confidence): 5 points or 10% points  COGNITION: Overall cognitive status: Within functional limits for tasks assessed  SENSATION: WFL  POSTURE: rounded shoulders and forward head  PALPATION: No tenderness to bil upper traps or perscap muscles    CERVICAL ROM:   Active ROM A/PROM (deg) eval  Flexion 40  Extension 15 had numbness  Right lateral flexion 35  Left lateral flexion 47  Right rotation 50  Left rotation 64   (Blank rows = not tested)  UPPER EXTREMITY ROM:  Active ROM Right eval Left eval  Shoulder flexion 135 130  Shoulder extension    Shoulder abduction 145 155  Shoulder adduction    Shoulder extension    Shoulder internal rotation 28 70  Shoulder external rotation 38 had numbness 40  Elbow flexion    Elbow extension    Wrist flexion    Wrist extension  Wrist ulnar deviation    Wrist radial deviation    Wrist pronation    Wrist supination     (Blank rows = not tested)  UPPER EXTREMITY MMT:  MMT Right eval Left eval  Shoulder flexion 4- 4  Shoulder extension 4- 4  Shoulder abduction 4- 4  Shoulder adduction    Shoulder extension    Shoulder internal rotation 4 4  Shoulder external rotation 4 4  Middle trapezius 3+ 4-  Lower trapezius 3+ 4-  Elbow flexion 4- 4  Elbow extension 4- 4  Wrist flexion 4- 4  Wrist extension 4- 4  Wrist ulnar deviation 4- 4  Wrist radial deviation 4- 4  Wrist pronation    Wrist supination    Grip strength     (Blank rows = not tested)    TREATMENT DATE:  [facility] Adult PT Treatment:                                                DATE: 08/27/24 Therapeutic Exercise: *** Manual Therapy: *** Neuromuscular re-ed: MELT Method to cervical region AROM shoulder retraction AROM cervical retraction   Therapeutic Activity: *** Modalities: *** Self Care: ***  [facility] Adult PT Treatment:                                                DATE: 08/16/24 Therapeutic Exercise: Ladder Y lift off x15 HEP review- demo and performed as needed   Neuromuscular re-ed: Rows in standing with green band 2x10 Subscapularis Lifts offs 2x10 each arm  Isometric walk outs ER and IR x10 each arm  AROM cervical retraction x20     [facility] Adult PT Treatment:                                                DATE: 08/13/24 Therapeutic Exercise: Swiss ball flexion up wall x12  Standing open books x10 BIL; significant verbal and tactile cueing for reduced shoulder hike RUE, reduced IR compensations HEP update + education/handout  Neuromuscular re-ed: Cervical sidebending isometric x10 BIL cues for midline  Cervical extension isometric x10  GB row x10 cues for periscapular activation - significant time for tactile/verbal cues to mitigate shoulder hike and facilitate retraction GB shoulder ext x10 cues for lat activation  and reduced elbow compensations Radial nerve glides modified 2x12 RUE cues for sequencing and appropriate ROM w/ regard to symptom behavior, inc time for education to allow for good management w/ HEP      [facility] Adult PT Treatment:                                                DATE: 08/09/24 Therapeutic Exercise: Supine chin tuck 2x12 cues for reduced compensations Supine BIL shoulder flexion 2x12 cues for breath control HEP update + education/handout   Neuromuscular re-ed: YB diagonals, 2x8 BIL cues for sequencing and motor control  Radial nerve glides 2x10 frequent tactile cues for sequencing, weaning with  repetition ( increased time to maximize self efficacy) Cervical sidebending isometrics to L x8 cues for appropriate force output and alignment  Cervical extension isometric x8 cues for appropriate force output  Self Care: Education throughout on relevant anatomy/physiology, rationale for interventions, monitoring symptoms and modifications PRN                                                                                                                                 PATIENT EDUCATION:  Education details: rationale for interventions, HEP  Person educated: Patient Education method: Explanation, Demonstration, Tactile cues, Verbal cues Education comprehension: verbalized understanding, returned demonstration, verbal cues required, tactile cues required, and needs further education     HOME EXERCISE PROGRAM: Access Code: [email or web address] URL: [email or web address] Date: 08/13/2024 Prepared by: Alm Jenny  Exercises - Supine Shoulder Horizontal Abduction with Resistance  - 2-3 x daily - 1 sets - 10 reps - Standing Shoulder Diagonal Horizontal Abduction 60/120 Degrees with Resistance  - 2-3 x daily - 1 sets - 6-8 reps - Supine Cervical Retraction with Towel  - 2-3 x daily - 1 sets - 8-10 reps - Supine Shoulder Flexion Extension Full Range AROM  - 2-3 x daily - 1 sets  - 10 reps - Standing Radial Nerve Glide  - 2-3 x daily - 1 sets - 8 reps  ASSESSMENT:  CLINICAL IMPRESSION: Pt presents to therapy with her HEP print out, went through each exercise thoroughly to monitor proper form technique and note any compensations. Today's focus on periscapular activating exercises and reviewing HEP. Required several verbal cues to complete horizontal Abduction correctly with arms out. Required several verbal and tactile cues for cervical retraction, progressed to performing in seated. Pt states significantly decreased numbness in arm since starting PT.     Per eval: Patient is a 58 y.o. female who was seen today for physical therapy evaluation and treatment for cervical neck pain. Pt reports she's had ongoing chronic neck pain for several years with intermittent flare ups, her most recent flare up was 3 months ago. Reports right shoulder into middle finger numbness and tingling down C5-C7 nerve root distribution. She has numbness the most during active right shoulder ER, cervical extension, and right SB. She has moderate difficulty reading, sleeping, and doing recreational activities, and has mild difficulty lifting, concentrating, and working. She has done PT before and it worked, however she admits she's not doing specific exercises. She presents with decreased cervical ROM, decreased periscapular muscle strength, and postural malalignment. Pt will benefit from skilled therapy to address the deficits below and increase functional capacity.   OBJECTIVE IMPAIRMENTS: decreased activity tolerance, decreased coordination, decreased endurance, decreased mobility, decreased ROM, decreased strength, increased fascial restrictions, increased muscle spasms, impaired flexibility, impaired tone, impaired UE functional use, improper body mechanics, and postural dysfunction.   ACTIVITY LIMITATIONS: carrying, lifting, bending, sleeping, transfers, bed mobility, bathing, toileting, dressing,  reach over head, and hygiene/grooming  PARTICIPATION LIMITATIONS: meal prep,  cleaning, laundry, medication management, driving, shopping, community activity, occupation, and yard work  PERSONAL FACTORS: Age, Education, Fitness, Past/current experiences, Profession, Time since onset of injury/illness/exacerbation, Transportation, and 3+ comorbidities: Current low back pain are also affecting patient's functional outcome.   REHAB POTENTIAL: Good  CLINICAL DECISION MAKING: Evolving/moderate complexity  EVALUATION COMPLEXITY: Moderate   GOALS: Goals reviewed with patient? Yes  SHORT TERM GOALS: Target date: 08/31/24  Pt will be efficient with advanced HEP so she is more independent with daily tasks.  Baseline:  Goal status: MET 08/16/24  2.   Pt will perform grip strength using dynamometer for baseline info and will create LTG based on baseline.  Baseline:  Goal status: INITIAL  3.  Pt will decrease NDI score at least 10 points so she increases functional capacity for household chores. Baseline: 19/50 Goal status: INITIAL    LONG TERM GOALS: Target date: 09/12/24   Pt will be efficient with advanced HEP so she is more independent with daily tasks.  Baseline:  Goal status: INITIAL  2.  Pt will demonstrate increased cervical ROM with R SB and extension without symptoms by at least 10 degrees so she is able to read without adverse reactions.  Baseline: Extension: 15 deg, R SB: 35 deg Goal status: INITIAL  3.  Pt will increase mid/low trap strength, demonstrating at least 4/5 MMT during so she is able to improve posture for working at her desk.  Baseline: 3+/5 for mid/low trap bil Goal status: INITIAL  4.  Pt will report less than 2/10 for numbness in right shoulder so she is able to concentrate during driving.  Baseline: 4/10 Goal status: INITIAL  5.  Pt will report interrupted sleep of less than one hour due to right shoulder numbness so she is able to sleep through the  night.  Baseline: 2-3 hours interrupted sleep  Goal status: INITIAL 6. TBD waiting for baseline of pt's grip strength testing  Baseline:  Goal status: INITIAL     PLAN:  PT FREQUENCY: 2x/week  PT DURATION: 8 weeks  PLANNED INTERVENTIONS: 97164- PT Re-evaluation, 97110-Therapeutic exercises, 97530- Therapeutic activity, 97112- Neuromuscular re-education, 97535- Self Care, 02859- Manual therapy, 97035- Ultrasound, 02987- Traction (mechanical), [phone] (1-2 muscles), [id number] (3+ muscles)- Dry Needling, Patient/Family education, Joint mobilization, Spinal mobilization, Cryotherapy, and Moist heat  PLAN FOR NEXT SESSION: Nerve glides, mid/low trap strengthening, manual therapy to neck   Lavanda Cleverly, SPT 08/24/24 1:01 PM  Lucie Meeter, PT, DPT, ATC 08/24/24 1:01 PM

## 2024-08-26 ENCOUNTER — Inpatient Hospital Stay
Admission: RE | Admit: 2024-08-26 | Discharge: 2024-08-26 | Payer: Self-pay | Attending: Family Medicine | Admitting: Family Medicine

## 2024-08-26 ENCOUNTER — Other Ambulatory Visit: Payer: Self-pay

## 2024-08-26 VITALS — BP 135/84 | HR 93 | Temp 98.2°F | Resp 18

## 2024-08-26 DIAGNOSIS — J069 Acute upper respiratory infection, unspecified: Secondary | ICD-10-CM

## 2024-08-26 DIAGNOSIS — R059 Cough, unspecified: Secondary | ICD-10-CM | POA: Diagnosis not present

## 2024-08-26 MED ORDER — AMOXICILLIN-POT CLAVULANATE 875-125 MG PO TABS: 1.0000 | ORAL_TABLET | Freq: Two times a day (BID) | ORAL | 0 refills | Status: AC

## 2024-08-26 MED ORDER — PREDNISONE 20 MG PO TABS: ORAL_TABLET | ORAL | 0 refills | Status: AC

## 2024-08-26 MED ORDER — HYDROCODONE BIT-HOMATROP MBR 5-1.5 MG/5ML PO SOLN: 5.0000 mL | Freq: Four times a day (QID) | ORAL | 0 refills | Status: AC | PRN

## 2024-08-26 NOTE — Discharge Instructions (Addendum)
 Advised patient take medications as directed with food to completion.  Advised take prednisone  with first dose of Augmentin for the next 5 of 7 days.  Advised may use Hycodan cough syrup at night prior to sleep for cough due to sedative effects.  Encouraged to increase daily water intake to 64 ounces per day while taking these medications.  Advised if symptoms worsen and/or unresolved please follow-up with your PCP or here for further evaluation.

## 2024-08-26 NOTE — ED Provider Notes (Signed)
 Judith [name] [facility]    CSN: [id number] Arrival date & time: 08/26/24  1149      History   Chief Complaint Chief Complaint  Patient presents with   Cough    Stuffy nose, congested, coughing. Just over a week and not getting better. - Entered by patient   Nasal Congestion    HPI Judith [name] is a 58 y.o. female.   HPI 68-year-old female presents with cough, congestion for 1 week.  PMH significant for anxiety, cervical DDD and OSA.  Past Medical History:  Diagnosis Date   Anxiety    Colon polyps    Hypercholesteremia     Patient Active Problem List   Diagnosis Date Noted   DDD (degenerative disc disease), cervical 06/26/2024   OSA (obstructive sleep apnea) 07/05/2022   Bilateral dry eyes 12/09/2021   Cervical radiculopathy 08/27/2021   Primary insomnia 11/12/2020   Chronic pain of right knee 11/12/2020   Left lateral epicondylitis 12/28/2019   Chronic low back pain 11/16/2019   Leukocytosis 10/30/2019   Elevated hemoglobin 10/30/2019   Tobacco abuse 06/13/2017   Gastroesophageal reflux disease without esophagitis 05/30/2017   Mixed hyperlipidemia 05/30/2017    Past Surgical History:  Procedure Laterality Date   LAPAROSCOPY      OB History     Gravida  5   Para  3   Term      Preterm      AB  2   Living  3      SAB  2   IAB      Ectopic      Multiple      Live Births               Home Medications    Prior to Admission medications   Medication Sig Start Date End Date Taking? Authorizing Provider  amoxicillin-clavulanate (AUGMENTIN) 875-125 MG tablet Take 1 tablet by mouth every 12 (twelve) hours. 08/26/24  Yes Teddy Sharper, FNP  aspirin  EC 81 MG tablet Take 1 tablet (81 mg total) by mouth daily. Swallow whole. 06/29/24  Yes Pietro Redell RAMAN, MD  atorvastatin  (LIPITOR) 40 MG tablet Take 1 tablet (40 mg total) by mouth daily. 05/15/24  Yes Pietro Redell RAMAN, MD  ezetimibe  (ZETIA ) 10 MG tablet Take 1 tablet (10 mg total) by mouth  daily. 05/15/24  Yes Pietro Redell RAMAN, MD  HYDROcodone bit-homatropine (HYCODAN) 5-1.5 MG/5ML syrup Take 5 mLs by mouth every 6 (six) hours as needed for cough. 08/26/24  Yes Teddy Sharper, FNP  predniSONE  (DELTASONE ) 20 MG tablet Take 3 tabs PO daily x 5 days. 08/26/24  Yes Teddy Sharper, FNP  pregabalin  (LYRICA ) 50 MG capsule Take 1 capsule (50 mg total) by mouth 2 (two) times daily. Patient not taking: No sig reported 07/13/23   Willo Mini, NP    Family History Family History  Problem Relation Age of Onset   Heart attack Mother    Alcoholism Mother    Heart attack Father    Alcoholism Father    Hyperlipidemia Brother    Stroke Brother    Osteopenia Sister     Social History Social History   Tobacco Use   Smoking status: Every Day    Current packs/day: 1.00    Average packs/day: 1 pack/day for 30.3 years (30.3 ttl pk-yrs)    Types: Cigarettes    Start date: 05/24/1994   Smokeless tobacco: Never   Tobacco comments:    also uses nicoteine patch  Vaping Use  Vaping status: Never Used  Substance Use Topics   Alcohol use: Yes    Comment: rarely   Drug use: Not Currently    Types: Marijuana     Allergies   Gabapentin    Review of Systems Review of Systems   Physical Exam Triage Vital Signs ED Triage Vitals  Encounter Vitals Group     BP      Girls Systolic BP Percentile      Girls Diastolic BP Percentile      Boys Systolic BP Percentile      Boys Diastolic BP Percentile      Pulse      Resp      Temp      Temp src      SpO2      Weight      Height      Head Circumference      Peak Flow      Pain Score      Pain Loc      Pain Education      Exclude from Growth Chart    No data found.  Updated Vital Signs BP 135/84 (BP Location: Right Arm)   Pulse 93   Temp 98.2 F (36.8 C) (Oral)   Resp 18   LMP 04/14/2017   SpO2 97%    Physical Exam Vitals and nursing note reviewed.  Constitutional:      Appearance: Normal appearance. She is normal  weight.  HENT:     Head: Normocephalic and atraumatic.     Right Ear: Tympanic membrane and external ear normal.     Left Ear: Tympanic membrane and external ear normal.     Ears:     Comments: Significant eustachian tube dysfunction noted bilaterally    Mouth/Throat:     Mouth: Mucous membranes are moist.     Pharynx: Oropharynx is clear.     Comments: Moderate amount of clear drainage of posterior oropharynx noted Eyes:     Extraocular Movements: Extraocular movements intact.     Conjunctiva/sclera: Conjunctivae normal.     Pupils: Pupils are equal, round, and reactive to light.  Cardiovascular:     Rate and Rhythm: Normal rate and regular rhythm.     Pulses: Normal pulses.     Heart sounds: Normal heart sounds.  Pulmonary:     Effort: Pulmonary effort is normal.     Breath sounds: No wheezing or rhonchi.     Comments: Diminished breath sounds noted throughout, frequent nonproductive cough on exam Musculoskeletal:        General: Normal range of motion.  Skin:    General: Skin is warm and dry.  Neurological:     General: No focal deficit present.     Mental Status: She is alert and oriented to person, place, and time. Mental status is at baseline.  Psychiatric:        Mood and Affect: Mood normal.        Behavior: Behavior normal.      UC Treatments / Results  Labs (all labs ordered are listed, but only abnormal results are displayed) Labs Reviewed - No data to display  EKG   Radiology No results found.  Procedures Procedures (including critical care time)  Medications Ordered in UC Medications - No data to display  Initial Impression / Assessment and Plan / UC Course  I have reviewed the triage vital signs and the nursing notes.  Pertinent labs & imaging results that were available during  my care of the patient were reviewed by me and considered in my medical decision making (see chart for details).     MDM: 1.  Acute URI-Rx'd Augmentin 875/125 mg  tablet: Take 1 tablet twice daily x 7 days; 2.  Cough, unspecified type-Rx prednisone  20 mg tablet: Take 3 tablets p.o. daily x 5 days, Rx'd Hycodan 5-1.5 mg / 5 mL syrup: Take 5 mL every 6 hours, as needed for cough. Advised patient take medications as directed with food to completion.  Advised take prednisone  with first dose of Augmentin for the next 5 of 7 days.  Advised may use Hycodan cough syrup at night prior to sleep for cough due to sedative effects.  Encouraged to increase daily water intake to 64 ounces per day while taking these medications.  Advised if symptoms worsen and/or unresolved please follow-up with your PCP or here for further evaluation.  Discharged home, hemodynamically stable. Final Clinical Impressions(s) / UC Diagnoses   Final diagnoses:  Acute URI  Cough, unspecified type     Discharge Instructions      Advised patient take medications as directed with food to completion.  Advised take prednisone  with first dose of Augmentin for the next 5 of 7 days.  Advised may use Hycodan cough syrup at night prior to sleep for cough due to sedative effects.  Encouraged to increase daily water intake to 64 ounces per day while taking these medications.  Advised if symptoms worsen and/or unresolved please follow-up with your PCP or here for further evaluation.     ED Prescriptions     Medication Sig Dispense Auth. Provider   amoxicillin-clavulanate (AUGMENTIN) 875-125 MG tablet Take 1 tablet by mouth every 12 (twelve) hours. 14 tablet Antoinette Haskett, FNP   predniSONE  (DELTASONE ) 20 MG tablet Take 3 tabs PO daily x 5 days. 15 tablet Densil Ottey, FNP   HYDROcodone bit-homatropine (HYCODAN) 5-1.5 MG/5ML syrup Take 5 mLs by mouth every 6 (six) hours as needed for cough. 120 mL Teddy Sharper, FNP      I have reviewed the PDMP during this encounter.   Teddy Sharper, FNP 08/26/24 1236

## 2024-08-26 NOTE — ED Triage Notes (Signed)
 Patient presents to Urgent Care with complaints of stuffy nose, nasal congestion and non productive cough, since 7 days ago. Taking Nyquil and Dayquil for symptoms with no relief. Her husband has bronchitis.

## 2024-08-27 ENCOUNTER — Ambulatory Visit: Payer: Self-pay

## 2024-08-29 ENCOUNTER — Ambulatory Visit: Payer: Self-pay

## 2024-09-03 ENCOUNTER — Encounter: Payer: Self-pay | Admitting: Physical Therapy

## 2024-09-03 ENCOUNTER — Ambulatory Visit: Payer: Self-pay | Attending: Medical-Surgical | Admitting: Physical Therapy

## 2024-09-03 DIAGNOSIS — M6281 Muscle weakness (generalized): Secondary | ICD-10-CM | POA: Diagnosis not present

## 2024-09-03 DIAGNOSIS — R293 Abnormal posture: Secondary | ICD-10-CM | POA: Diagnosis not present

## 2024-09-03 DIAGNOSIS — M5412 Radiculopathy, cervical region: Secondary | ICD-10-CM | POA: Diagnosis not present

## 2024-09-03 NOTE — Therapy (Signed)
 OUTPATIENT PHYSICAL THERAPY TREATMENT   Patient Name: Judith [name] MRN: [id number] DOB:02/25/1966, 58 y.o., female Today's Date: 09/03/2024  END OF SESSION:  PT End of Session - 09/03/24 0759     Visit Number 6    Number of Visits 17    Date for Recertification  09/12/24    Authorization Type [employer]    PT Start Time 0800    PT Stop Time 0844    PT Time Calculation (min) 44 min               Past Medical History:  Diagnosis Date   Anxiety    Colon polyps    Hypercholesteremia    Past Surgical History:  Procedure Laterality Date   LAPAROSCOPY     Patient Active Problem List   Diagnosis Date Noted   DDD (degenerative disc disease), cervical 06/26/2024   OSA (obstructive sleep apnea) 07/05/2022   Bilateral dry eyes 12/09/2021   Cervical radiculopathy 08/27/2021   Primary insomnia 11/12/2020   Chronic pain of right knee 11/12/2020   Left lateral epicondylitis 12/28/2019   Chronic low back pain 11/16/2019   Leukocytosis 10/30/2019   Elevated hemoglobin 10/30/2019   Tobacco abuse 06/13/2017   Gastroesophageal reflux disease without esophagitis 05/30/2017   Mixed hyperlipidemia 05/30/2017    PCP: Zada Palin, NP  REFERRING PROVIDER: Waddell Lager, MD  REFERRING DIAG:    M54.12 (ICD-10-CM) - Radiculopathy, cervical region     THERAPY DIAG:  Radiculopathy, cervical region  Muscle weakness (generalized)  Abnormal posture   Rationale for Evaluation and Treatment: Rehabilitation  ONSET DATE: Chronic with intermittent flareups, recent flareup 3 months ago  SUBJECTIVE:                                                                                                                                                                                                        Per eval: Pt reports from her right shoulder to her fingers it goes numb. Pt denies pain, however rates numbness as having 4/10. Pt states numbess is worse at night, esp into  middle finger. She states she is also having low back pain that she plans to be treated later,however her neck/right arm  numbness is her priority.  Hand dominance: Right   SUBJECTIVE STATEMENT: 09/03/2024: states she was under the weather but feeling better. States numbness has been doing better - occurring not a whole lot, mostly when lying down. Sees ortho Wednesday. No resting numbness but will feel it when she turns head to L, up to 2-3/10. Worst 5/10.  PERTINENT HISTORY:  Leukocytosis Chronic right knee pain Chronic LBP Left lateral epicondylitis Cervical radiculopathy DDD Smoker Allergic to gabapentin    PAIN:  Are you having pain? 09/03/2024: no pain/numbness at rest. Will have 1-2/10 numbness RUE with sidebending to L   Per eval: Yes: NPRS scale: 1/10 currently; 6/10 at [facility] at night  Pain location: Superior right shoulder down into middle finger  Pain description: like I pulled a muscle feeling , tingling feeling esp into middle finger  Aggravating factors: shoulder abducted position, neck extension  Relieving factors: NSAIDS, TENS unit; tried PT and epidural steroid injection years ago but not recently  PRECAUTIONS: None  RED FLAGS: None     WEIGHT BEARING RESTRICTIONS: No  FALLS:  Has patient fallen in last 6 months? 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  DIAGNOSTIC FINDINGS:  IMPRESSION: 1. Multilevel cervical spondylosis with resultant mild to moderate spinal stenosis at C4-5 through C6-7. 2. Multifactorial degenerative changes  with resultant multilevel foraminal narrowing as above. Notable findings include moderate right C5 foraminal stenosis, severe left worse than right C6 foraminal narrowing, with severe right and moderate left C7 foraminal stenosis. 3. Overall, these changes are mildly progressed as compared to prior MRI from 10/11/2021.      PATIENT SURVEYS:  NDI:  NECK DISABILITY INDEX  Date: 08/01/24 Score  Pain intensity 2 = The pain is moderate at the moment  2. Personal care (washing, dressing, etc.) 1 =  I can look after myself normally but it causes extra pain  3. Lifting 2 = Pain prevents me lifting heavy weights off the floor, but I can manage if they are  conveniently placed, for example on a table  4. Reading 3 = I can't read as much as I want because of moderate pain in my neck  5. Headaches 0 = I have no headaches at all  6. Concentration 2 = I have a fair degree of difficulty in concentrating when I want to  7. Work 2 = I can do most of my usual work, but no more  8. Driving 1 =  I can drive my car as long as I want with slight pain in my neck  9. Sleeping 3 =  My sleep is moderately disturbed (2-3 hrs sleepless)  10. Recreation 3 = I am able to engage in a few of my usual recreation activities because of pain in   my neck  Total 19/50   Minimum Detectable Change (90% confidence): 5 points or 10% points   09/03/24 NDI: 11/50; 22%  COGNITION: Overall cognitive status: Within functional limits for tasks assessed  SENSATION: WFL  POSTURE: rounded shoulders and forward head  PALPATION: No tenderness to bil upper traps or perscap muscles    CERVICAL ROM:   Active ROM A/PROM (deg) eval  Flexion 40  Extension 15 had numbness  Right lateral flexion 35  Left lateral flexion 47  Right rotation 50  Left rotation 64   (Blank rows = not tested)  UPPER EXTREMITY ROM:  Active ROM Right eval Left eval  Shoulder flexion  135 130  Shoulder extension    Shoulder abduction 145 155   Shoulder adduction    Shoulder extension    Shoulder internal rotation 28 70  Shoulder external rotation 38 had numbness 40  Elbow flexion    Elbow extension    Wrist flexion    Wrist extension    Wrist ulnar deviation    Wrist radial deviation    Wrist pronation    Wrist supination     (Blank rows = not tested)  UPPER EXTREMITY MMT:  MMT Right eval Left eval R/L 09/03/24  Shoulder flexion 4- 4 4/4+  Shoulder extension 4- 4   Shoulder abduction 4- 4 4/4+  Shoulder adduction     Shoulder extension     Shoulder internal rotation 4 4   Shoulder external rotation 4 4   Middle trapezius 3+ 4-   Lower trapezius 3+ 4-   Elbow flexion 4- 4   Elbow extension 4- 4   Wrist flexion 4- 4   Wrist extension 4- 4   Wrist ulnar deviation 4- 4   Wrist radial deviation 4- 4   Wrist pronation     Wrist supination     Grip strength   75#/70#   (Blank rows = not tested)    TREATMENT DATE:  [facility] Adult PT Treatment:                                                DATE: 09/03/24  Neuromuscular re-ed: GB row x12, blue band x12 GB shoulder ext x12, blue band x12 RB W 2x8 cues for posture Chin tuck into ball at wall 2x10 cues to reduce trunk lean, improve motor control  KB bottoms up carry; 5# 2x1 lap BIL cues for stability and setup Radial nerve glides x15 Radial nerve glides + head turns x10  Therapeutic Activity: NDI + education MMT/grip strength testing + education  [facility] Adult PT Treatment:                                                DATE: 08/16/24 Therapeutic Exercise: Ladder Y lift off x15 HEP review- demo and performed as needed   Neuromuscular re-ed: Rows in standing with green band 2x10 Subscapularis Lifts offs 2x10 each arm  Isometric walk outs ER and IR x10 each arm  AROM cervical retraction x20     [facility] Adult PT Treatment:                                                DATE: 08/13/24 Therapeutic Exercise: Swiss ball flexion up wall x12  Standing open  books x10 BIL; significant verbal and tactile cueing for reduced shoulder hike RUE, reduced IR compensations HEP update + education/handout  Neuromuscular re-ed: Cervical sidebending isometric x10 BIL cues for midline  Cervical extension isometric x10  GB row x10 cues for periscapular activation - significant time for tactile/verbal cues to mitigate shoulder hike and facilitate retraction GB shoulder ext x10 cues for lat activation and reduced elbow compensations Radial nerve glides modified 2x12 RUE cues for sequencing and appropriate ROM w/  regard to symptom behavior, inc time for education to allow for good management w/ HEP                                                                                                                              PATIENT EDUCATION:  Education details: rationale for interventions, HEP  Person educated: Patient Education method: Explanation, Demonstration, Tactile cues, Verbal cues Education comprehension: verbalized understanding, returned demonstration, verbal cues required, tactile cues required, and needs further education     HOME EXERCISE PROGRAM: Access Code: [email or web address] URL: [email or web address] Date: 09/03/2024 Prepared by: Alm Jenny  Exercises - Supine Shoulder Horizontal Abduction with Resistance  - 2-3 x daily - 1 sets - 10 reps - Standing Shoulder Diagonal Horizontal Abduction 60/120 Degrees with Resistance  - 2-3 x daily - 1 sets - 6-8 reps - Supine Cervical Retraction with Towel  - 2-3 x daily - 1 sets - 8-10 reps - Supine Shoulder Flexion Extension Full Range AROM  - 2-3 x daily - 1 sets - 10 reps - Standing Radial Nerve Glide  - 2-3 x daily - 1 sets - 8 reps - Half Kneel Row W  - 2-3 x daily - 1 sets - 10 reps  ASSESSMENT:  CLINICAL IMPRESSION: 09/03/2024: Pt arrives w/ report of continued improvement - no resting numbness, 2-3/10 when turning head to L. Today continuing to work on periscapular stability,  cervical stability, and neural desensitization. Tolerates session well overall, notes reduced numbness as session goes on with L cervical rotation retest. Cues as above, no adverse events. STG addressed below, pt making good progress overall. Recommend continuing along current POC in order to address relevant deficits and improve functional tolerance. Pt departs today's session in no acute distress, all voiced questions/concerns addressed appropriately from PT perspective.     Per eval: Patient is a 58 y.o. female who was seen today for physical therapy evaluation and treatment for cervical neck pain. Pt reports she's had ongoing chronic neck pain for several years with intermittent flare ups, her most recent flare up was 3 months ago. Reports right shoulder into middle finger numbness and tingling down C5-C7 nerve root distribution. She has numbness the most during active right shoulder ER, cervical extension, and right SB. She has moderate difficulty reading, sleeping, and doing recreational activities, and has mild difficulty lifting, concentrating, and working. She has done PT before and it worked, however she admits she's not doing specific exercises. She presents with decreased cervical ROM, decreased periscapular muscle strength, and postural malalignment. Pt will benefit from skilled therapy to address the deficits below and increase functional capacity.   OBJECTIVE IMPAIRMENTS: decreased activity tolerance, decreased coordination, decreased endurance, decreased mobility, decreased ROM, decreased strength, increased fascial restrictions, increased muscle spasms, impaired flexibility, impaired tone, impaired UE functional use, improper body mechanics, and postural dysfunction.   ACTIVITY LIMITATIONS: carrying, lifting, bending, sleeping, transfers, bed mobility, bathing, toileting, dressing, reach over head, and hygiene/grooming  PARTICIPATION  LIMITATIONS: meal prep, cleaning, laundry, medication  management, driving, shopping, community activity, occupation, and yard work  PERSONAL FACTORS: Age, Education, Fitness, Past/current experiences, Profession, Time since onset of injury/illness/exacerbation, Transportation, and 3+ comorbidities: Current low back pain are also affecting patient's functional outcome.   REHAB POTENTIAL: Good  CLINICAL DECISION MAKING: Evolving/moderate complexity  EVALUATION COMPLEXITY: Moderate   GOALS: Goals reviewed with patient? Yes  SHORT TERM GOALS: Target date: 08/31/24  Pt will be efficient with advanced HEP so she is more independent with daily tasks.  Baseline:  Goal status: MET 08/16/24  2.   Pt will perform grip strength using dynamometer for baseline info and will create LTG based on baseline.  Baseline:  09/03/24: see MMT chart above Goal status: MET  3.  Pt will decrease NDI score at least 10 points so she increases functional capacity for household chores. Baseline: 19/50 09/03/24: 11/50  Goal status: PROGRESSING     LONG TERM GOALS: Target date: 09/12/24   Pt will be efficient with advanced HEP so she is more independent with daily tasks.  Baseline:  Goal status: INITIAL  2.  Pt will demonstrate increased cervical ROM with R SB and extension without symptoms by at least 10 degrees so she is able to read without adverse reactions.  Baseline: Extension: 15 deg, R SB: 35 deg Goal status: INITIAL  3.  Pt will increase mid/low trap strength, demonstrating at least 4/5 MMT during so she is able to improve posture for working at her desk.  Baseline: 3+/5 for mid/low trap bil Goal status: INITIAL  4.  Pt will report less than 2/10 for numbness in right shoulder so she is able to concentrate during driving.  Baseline: 4/10 Goal status: INITIAL  5.  Pt will report interrupted sleep of less than one hour due to right shoulder numbness so she is able to sleep through the night.  Baseline: 2-3 hours interrupted sleep  Goal status:  INITIAL 6. TBD waiting for baseline of pt's grip strength testing  Baseline:  Goal status: INITIAL     PLAN:  PT FREQUENCY: 2x/week  PT DURATION: 8 weeks  PLANNED INTERVENTIONS: [id number]- PT Re-evaluation, 97110-Therapeutic exercises, 97530- Therapeutic activity, 97112- Neuromuscular re-education, 97535- Self Care, [id number]- Manual therapy, 97035- Ultrasound, 02987- Traction (mechanical), [phone] (1-2 muscles), [id number] (3+ muscles)- Dry Needling, Patient/Family education, Joint mobilization, Spinal mobilization, Cryotherapy, and Moist heat  PLAN FOR NEXT SESSION: Nerve glides, mid/low trap strengthening, manual therapy to neck   Alm DELENA Jenny PT, DPT 09/03/2024 8:48 AM

## 2024-09-04 NOTE — Therapy (Addendum)
 OUTPATIENT PHYSICAL THERAPY PROGRESS NOTE + RECERTIFICATION + NO VISIT DISCHARGE SUMMARY (see below)    Patient Name: Judith [name] MRN: [id number] DOB:1966-03-02, 58 y.o., female Today's Date: 09/05/2024   Progress Note Reporting Period 08/01/24 to 09/05/24  See note below for Objective Data and Assessment of Progress/Goals.      END OF SESSION:  PT End of Session - 09/05/24 0713     Visit Number 7    Number of Visits 11    Date for Recertification  10/03/24    Authorization Type [employer]    PT Start Time [phone]    PT Stop Time 0753    PT Time Calculation (min) 39 min            Past Medical History:  Diagnosis Date   Anxiety    Colon polyps    Hypercholesteremia    Past Surgical History:  Procedure Laterality Date   LAPAROSCOPY     Patient Active Problem List   Diagnosis Date Noted   DDD (degenerative disc disease), cervical 06/26/2024   OSA (obstructive sleep apnea) 07/05/2022   Bilateral dry eyes 12/09/2021   Cervical radiculopathy 08/27/2021   Primary insomnia 11/12/2020   Chronic pain of right knee 11/12/2020   Left lateral epicondylitis 12/28/2019   Chronic low back pain 11/16/2019   Leukocytosis 10/30/2019   Elevated hemoglobin 10/30/2019   Tobacco abuse 06/13/2017   Gastroesophageal reflux disease without esophagitis 05/30/2017   Mixed hyperlipidemia 05/30/2017    PCP: Zada Palin, NP  REFERRING PROVIDER: Waddell Lager, MD  REFERRING DIAG:    M54.12 (ICD-10-CM) - Radiculopathy, cervical region     THERAPY DIAG:  Radiculopathy, cervical region  Muscle weakness (generalized)  Abnormal posture   Rationale for Evaluation and Treatment: Rehabilitation  ONSET DATE: Chronic with intermittent flareups, recent flareup 3 months ago  SUBJECTIVE:                                                                                                                                                                                                         Per eval: Pt reports from her right shoulder to her fingers it goes numb. Pt denies pain, however rates numbness as having 4/10. Pt states numbess is worse at night, esp into middle finger. She states she is also having low back pain that she plans to be treated later,however her neck/right arm  numbness is her priority.  Hand dominance: Right   SUBJECTIVE STATEMENT: 09/05/2024: Most of her numbness is when sleeping on stomach with head turned. No more than  1/10 numbness during the day at this point. Feels like exercises have been helpful in managing symptoms. Symptoms occur distally less often. Overall feels she has made good progress since starting PT. Follows with physician later this morning.     PERTINENT HISTORY:  Leukocytosis Chronic right knee pain Chronic LBP Left lateral epicondylitis Cervical radiculopathy DDD Smoker Allergic to gabapentin    PAIN:  Are you having pain? 09/05/2024: no pain/numbness at rest. No more than 4/10 numbness in past week, occurs when lying down at night w/ head turned to L. During the day no more than 1/10  Per eval: Yes: NPRS scale: 1/10 currently; 6/10 at [facility] at night  Pain location: [place] right shoulder down into middle finger  Pain description: like I pulled a muscle feeling , tingling feeling esp into middle finger  Aggravating factors: shoulder abducted position, neck extension  Relieving factors: NSAIDS, TENS unit; tried PT and epidural steroid injection years ago but not recently  PRECAUTIONS: None  RED FLAGS: None     WEIGHT BEARING RESTRICTIONS: No  FALLS:  Has patient fallen in last 6 months? 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  DIAGNOSTIC FINDINGS:  IMPRESSION: 1. Multilevel cervical spondylosis with resultant mild to moderate spinal stenosis at C4-5 through C6-7. 2. Multifactorial degenerative changes with resultant multilevel foraminal narrowing as above. Notable findings include moderate right C5 foraminal stenosis, severe left worse than right C6 foraminal narrowing, with severe right and moderate left C7 foraminal stenosis. 3. Overall, these changes are mildly progressed as compared to prior MRI from 10/11/2021.      PATIENT SURVEYS:  NDI:  NECK DISABILITY INDEX  Date: 08/01/24 Score  Pain intensity 2 = The pain is moderate at the moment  2. Personal care (washing, dressing, etc.) 1 =  I can look after myself normally but it causes extra pain  3. Lifting 2 = Pain prevents me lifting heavy weights off the floor, but I can manage if they are  conveniently placed, for example on a table  4. Reading 3 = I can't read as much as I want because of moderate pain in my neck  5. Headaches 0 = I have no headaches at all  6. Concentration 2 = I have a fair degree of difficulty in concentrating when I want to  7. Work 2 = I can do most of my usual work, but no more  8. Driving 1 =  I can drive my car as long as I want with slight pain in my neck  9. Sleeping 3 =  My sleep is moderately disturbed (2-3 hrs sleepless)  10. Recreation 3 = I am able to engage in a few of my usual recreation activities because of pain in   my neck  Total 19/50   Minimum Detectable Change (90% confidence): 5 points or 10% points   09/03/24 NDI: 11/50; 22%   COGNITION: Overall cognitive status: Within functional limits for tasks assessed  SENSATION: WFL  POSTURE: rounded shoulders and forward head  PALPATION: No tenderness to bil upper traps or perscap muscles  CERVICAL ROM:   Active ROM A/PROM  (deg) eval 09/05/24    Flexion 40 55 deg  Extension 15 had numbness 60 deg (numbness at 40 deg)  Right lateral flexion 35 40  Left lateral flexion 47 35 (numbness at 30 deg)  Right rotation 50 64 deg  Left rotation 64 58 deg (numbness at 50 deg)   (Blank rows = not tested)  UPPER EXTREMITY ROM:  Active ROM Right eval Left eval  Shoulder flexion 135 130  Shoulder extension    Shoulder abduction 145 155  Shoulder adduction    Shoulder extension    Shoulder internal rotation 28 70  Shoulder external rotation 38 had numbness 40  Elbow flexion    Elbow extension    Wrist flexion    Wrist extension    Wrist ulnar deviation    Wrist radial deviation    Wrist pronation    Wrist supination     (Blank rows = not tested)  UPPER EXTREMITY MMT:  MMT Right eval Left eval R/L 09/03/24 R/L 09/05/24  Shoulder flexion 4- 4 4/4+    Shoulder extension 4- 4    Shoulder abduction 4- 4 4/4+   Shoulder adduction      Shoulder extension      Shoulder internal rotation 4 4    Shoulder external rotation 4 4    Middle trapezius 3+ 4-  4/4  Lower trapezius 3+ 4-  4/4  Elbow flexion 4- 4  4+/4+  Elbow extension 4- 4  4/4+  Wrist flexion 4- 4  4+/4+  Wrist extension 4- 4  4+/4+  Wrist ulnar deviation 4- 4    Wrist radial deviation 4- 4    Wrist pronation      Wrist supination      Grip strength   75#/70#    (Blank rows = not tested)    TREATMENT DATE:   [facility] Adult PT Treatment:                                                DATE: 09/05/24 Therapeutic Exercise: Standing cervical rotation w/ ball at wall x10 BIL  HEP update + education/handout  Neuromuscular re-ed: Radial nerve glides + head turns x12 RUE Cervical sidebending isometrics in neutral x8 BIL cues for comfortable force output Cervical sidebending isometrics, slight sidebending x8 BIL  Standing chin tuck ball at wall x12 cues to mitigate compensations KB bottoms up carry 5# 3 laps BIL  Therapeutic Activity: MSK  assessment + educaiton Education/discussion re: progress with PT, symptom behavior as it affects activity tolerance, PT goals/POC     [facility] Adult PT Treatment:                                                DATE: 09/03/24  Neuromuscular re-ed: GB row x12, blue band x12 GB shoulder ext x12, blue band x12 RB W 2x8 cues for posture Chin tuck into ball at wall 2x10 cues to reduce trunk lean, improve motor control  KB bottoms up carry; 5# 2x1 lap BIL cues for stability and setup Radial nerve glides x15 Radial nerve glides + head turns x10  Therapeutic Activity: NDI + education MMT/grip strength testing + education  [facility] Adult  PT Treatment:                                                DATE: 08/16/24 Therapeutic Exercise: Ladder Y lift off x15 HEP review- demo and performed as needed   Neuromuscular re-ed: Rows in standing with green band 2x10 Subscapularis Lifts offs 2x10 each arm  Isometric walk outs ER and IR x10 each arm  AROM cervical retraction x20                                                                                                                          PATIENT EDUCATION:  Education details: rationale for interventions, HEP, progress note activities  Person educated: Patient Education method: Explanation, Demonstration, Tactile cues, Verbal cues Education comprehension: verbalized understanding, returned demonstration, verbal cues required, tactile cues required, and needs further education     HOME EXERCISE PROGRAM: Access Code: [email or web address] URL: [email or web address] Date: 09/05/2024 Prepared by: Alm Jenny  Exercises - Supine Cervical Retraction with Towel  - 2-3 x daily - 1 sets - 8-10 reps - Supine Shoulder Flexion Extension Full Range AROM  - 2-3 x daily - 1 sets - 10 reps - Standing Radial Nerve Glide  - 2-3 x daily - 1 sets - 8 reps - Supine Cervical Rotation AROM on Flat Ball  - 2-3 x daily - 1 sets - 8 reps - Half Kneel Row W  - 2-3  x daily - 1 sets - 10 reps - Standing Isometric Cervical Sidebending with Manual Resistance  - 2-3 x daily - 1 sets - 5 reps - Standing Shoulder Diagonal Horizontal Abduction 60/120 Degrees with Resistance  - 2-3 x daily - 1 sets - 6-8 reps - Supine Shoulder Horizontal Abduction with Resistance  - 2-3 x daily - 1 sets - 10 reps  ASSESSMENT:  CLINICAL IMPRESSION: 09/05/2024: Pt arrives w/ report of continued progress. Looking at LTG, she demonstrates notable improvements in cervical ROM and UE/scapular strength, although mild deficits persist and numbness remains provoked by cervical ext/sidebending/rotation to L. Continues to have some difficulty sleeping although daily function has improved. In discussion w/ pt, mutual decision is made to extend POC as noted below, although she would like to discuss with her physician today before scheduling additional visits which is respected. Tolerates treatment well with continued improvement in symptom retest as session goes on, no adverse events. Pt departs today's session in no acute distress, all voiced questions/concerns addressed appropriately from PT perspective.         Per eval: Patient is a 58 y.o. female who was seen today for physical therapy evaluation and treatment for cervical neck pain. Pt reports she's had ongoing chronic neck pain for several years with intermittent flare ups, her most recent flare up was 3 months ago. Reports right shoulder into middle finger numbness and tingling  down C5-C7 nerve root distribution. She has numbness the most during active right shoulder ER, cervical extension, and right SB. She has moderate difficulty reading, sleeping, and doing recreational activities, and has mild difficulty lifting, concentrating, and working. She has done PT before and it worked, however she admits she's not doing specific exercises. She presents with decreased cervical ROM, decreased periscapular muscle strength, and postural malalignment.  Pt will benefit from skilled therapy to address the deficits below and increase functional capacity.   OBJECTIVE IMPAIRMENTS: decreased activity tolerance, decreased coordination, decreased endurance, decreased mobility, decreased ROM, decreased strength, increased fascial restrictions, increased muscle spasms, impaired flexibility, impaired tone, impaired UE functional use, improper body mechanics, and postural dysfunction.   ACTIVITY LIMITATIONS: carrying, lifting, bending, sleeping, transfers, bed mobility, bathing, toileting, dressing, reach over head, and hygiene/grooming  PARTICIPATION LIMITATIONS: meal prep, cleaning, laundry, medication management, driving, shopping, community activity, occupation, and yard work  PERSONAL FACTORS: Age, Education, Financial Risk Analyst, Past/current experiences, Profession, Time since onset of injury/illness/exacerbation, Transportation, and 3+ comorbidities: Current low back pain are also affecting patient's functional outcome.   REHAB POTENTIAL: Good  CLINICAL DECISION MAKING: Evolving/moderate complexity  EVALUATION COMPLEXITY: Moderate   GOALS: Goals reviewed with patient? Yes  SHORT TERM GOALS: Target date: 08/31/24  Pt will be efficient with advanced HEP so she is more independent with daily tasks.  Baseline:  Goal status: MET 08/16/24  2.   Pt will perform grip strength using dynamometer for baseline info and will create LTG based on baseline.  Baseline:  09/03/24: see MMT chart above Goal status: MET  3.  Pt will decrease NDI score at least 10 points so she increases functional capacity for household chores. Baseline: 19/50 09/03/24: 11/50  Goal status: PROGRESSING     LONG TERM GOALS: Target date: 10/03/2024 (updated 09/05/24)    Pt will be efficient with advanced HEP so she is more independent with daily tasks.  Baseline:  09/05/24: reports 2-3x/day, doing well Goal status: PROGRESSING  2.  Pt will demonstrate increased cervical ROM  with R SB and extension without symptoms by at least 10 degrees so she is able to read without adverse reactions.  Baseline: Extension: 15 deg, R SB: 35 deg 09/05/24: see ROM chart above Goal status: PROGRESSING  3.  Pt will increase mid/low trap strength, demonstrating at least 4/5 MMT during so she is able to improve posture for working at her desk.  Baseline: 3+/5 for mid/low trap bil 09/05/24: 4/5 MMT BIL Goal status: MET  4.  Pt will report less than 2/10 for numbness in right shoulder so she is able to concentrate during driving.  Baseline: 4/10 09/05/24: 1-2/10 during day, up 4/10 at night Goal status: PARTIALLY MET  5.  Pt will report interrupted sleep of less than one hour due to right shoulder numbness so she is able to sleep through the night.  Baseline: 2-3 hours interrupted sleep  09/05/24: wakes 1-2 times a night, able to get back to sleep more quickly  Goal status: PROGRESSING/PARTIALLY MET  6. TBD waiting for baseline of pt's grip strength testing  Baseline:  09/05/24: not indicated  Goal status: deferred    PLAN: (updated 09/05/24)  PT FREQUENCY: 1x/week   PT DURATION: 4 weeks  PLANNED INTERVENTIONS: 97164- PT Re-evaluation, 97110-Therapeutic exercises, 97530- Therapeutic activity, 97112- Neuromuscular re-education, 97535- Self Care, 02859- Manual therapy, 97035- Ultrasound, 02987- Traction (mechanical), 20560 (1-2 muscles), 20561 (3+ muscles)- Dry Needling, Patient/Family education, Joint mobilization, Spinal mobilization, Cryotherapy, and Moist heat  PLAN FOR  NEXT SESSION: nerve glides as indicated. Continue with cervical stability work, periscapular/GH stability/strength. Postural endurance.   Alm DELENA Jenny PT, DPT 09/05/2024 12:40 PM      Discharge addendum 10/09/2024  PHYSICAL THERAPY DISCHARGE SUMMARY  Visits from Start of Care: 7  Current functional level related to goals / functional outcomes: Unable to be assessed   Remaining  deficits: Unable to be assessed   Education / Equipment: Unable to be assessed  Patient goals were unable to be assessed. Patient is being discharged due to not returning since the last visit.  Alm DELENA Jenny PT, DPT 10/09/2024 11:40 AM

## 2024-09-05 ENCOUNTER — Encounter: Payer: Self-pay | Admitting: Physical Therapy

## 2024-09-05 ENCOUNTER — Ambulatory Visit: Payer: Self-pay | Admitting: Physical Therapy

## 2024-09-05 DIAGNOSIS — M5412 Radiculopathy, cervical region: Secondary | ICD-10-CM | POA: Diagnosis not present

## 2024-09-05 DIAGNOSIS — M6281 Muscle weakness (generalized): Secondary | ICD-10-CM

## 2024-09-05 DIAGNOSIS — R293 Abnormal posture: Secondary | ICD-10-CM

## 2024-09-24 ENCOUNTER — Other Ambulatory Visit: Payer: Self-pay | Admitting: Medical-Surgical

## 2024-09-24 DIAGNOSIS — Z1231 Encounter for screening mammogram for malignant neoplasm of breast: Secondary | ICD-10-CM

## 2024-10-22 ENCOUNTER — Ambulatory Visit
Admission: RE | Admit: 2024-10-22 | Discharge: 2024-10-22 | Disposition: A | Discharge status: Home / Self Care | Source: Ambulatory Visit | Attending: Medical-Surgical | Admitting: Medical-Surgical

## 2024-10-22 DIAGNOSIS — Z1231 Encounter for screening mammogram for malignant neoplasm of breast: Secondary | ICD-10-CM

## 2024-10-26 ENCOUNTER — Ambulatory Visit: Payer: Self-pay | Admitting: Medical-Surgical

## 2025-05-27 ENCOUNTER — Encounter: Admitting: Medical-Surgical
